# Patient Record
Sex: Female | Born: 1945 | Race: White | Hispanic: No | Marital: Single | Smoking: Current every day smoker
Health system: Southern US, Community
[De-identification: ages and names within clinical notes are randomized; demographics above are authoritative.]

## PROBLEM LIST (undated history)

## (undated) DIAGNOSIS — K219 Gastro-esophageal reflux disease without esophagitis: Secondary | ICD-10-CM

## (undated) DIAGNOSIS — F329 Major depressive disorder, single episode, unspecified: Secondary | ICD-10-CM

## (undated) DIAGNOSIS — F32A Depression, unspecified: Secondary | ICD-10-CM

## (undated) DIAGNOSIS — I10 Essential (primary) hypertension: Secondary | ICD-10-CM

## (undated) DIAGNOSIS — M810 Age-related osteoporosis without current pathological fracture: Secondary | ICD-10-CM

## (undated) DIAGNOSIS — I639 Cerebral infarction, unspecified: Secondary | ICD-10-CM

## (undated) DIAGNOSIS — N189 Chronic kidney disease, unspecified: Secondary | ICD-10-CM

## (undated) HISTORY — DX: Major depressive disorder, single episode, unspecified: F32.9

## (undated) HISTORY — DX: Chronic kidney disease, unspecified: N18.9

## (undated) HISTORY — DX: Age-related osteoporosis without current pathological fracture: M81.0

## (undated) HISTORY — DX: Depression, unspecified: F32.A

---

## 2003-06-01 ENCOUNTER — Ambulatory Visit (HOSPITAL_COMMUNITY): Admission: RE | Admit: 2003-06-01 | Discharge: 2003-06-01 | Payer: Self-pay | Admitting: Neurosurgery

## 2003-06-12 ENCOUNTER — Inpatient Hospital Stay (HOSPITAL_COMMUNITY): Admission: RE | Admit: 2003-06-12 | Discharge: 2003-06-17 | Payer: Self-pay | Admitting: Neurosurgery

## 2005-01-29 IMAGING — CR DG CHEST 2V PORT
2 series · 2 of 2 positions shown · non-contrast
Comparison: none

CLINICAL DATA: Patient having post-operative difficulty breathing after cervical disc surgery.
 PORTABLE CHEST X TWO WITH INSPIRATION AND EXPIRATION VIEWS 
 An AP inspiration view in the semierect position of the chest made on 06/12/03 at [DATE] hours is made and compared to the preoperative study of 06/11/03 and shows poorer bilateral basilar atelectasis.  There also is generalized edema of both hilar, basilar, and particularly the right lower lobe areas where there are very prominent Kerley B lines.  The heart is not significantly enlarged.  The aorta is mildly elongated and calcified.  No pleural effusion, pneumothorax, or consolidation is present. 
 An associated expiratory study shows no evidence of pneumothorax.  There is on the expiratory view even more dramatic edema seen within the Kerley B lines of the right lower lung.  There is also bilateral hilar edema again noted. 
 IMPRESSION
 Interval development of edema involving the hilar, basilar, and particularly the right lower lobe areas.  There is no consolidation or pneumothorax.

[view not recorded (1 of 2)]
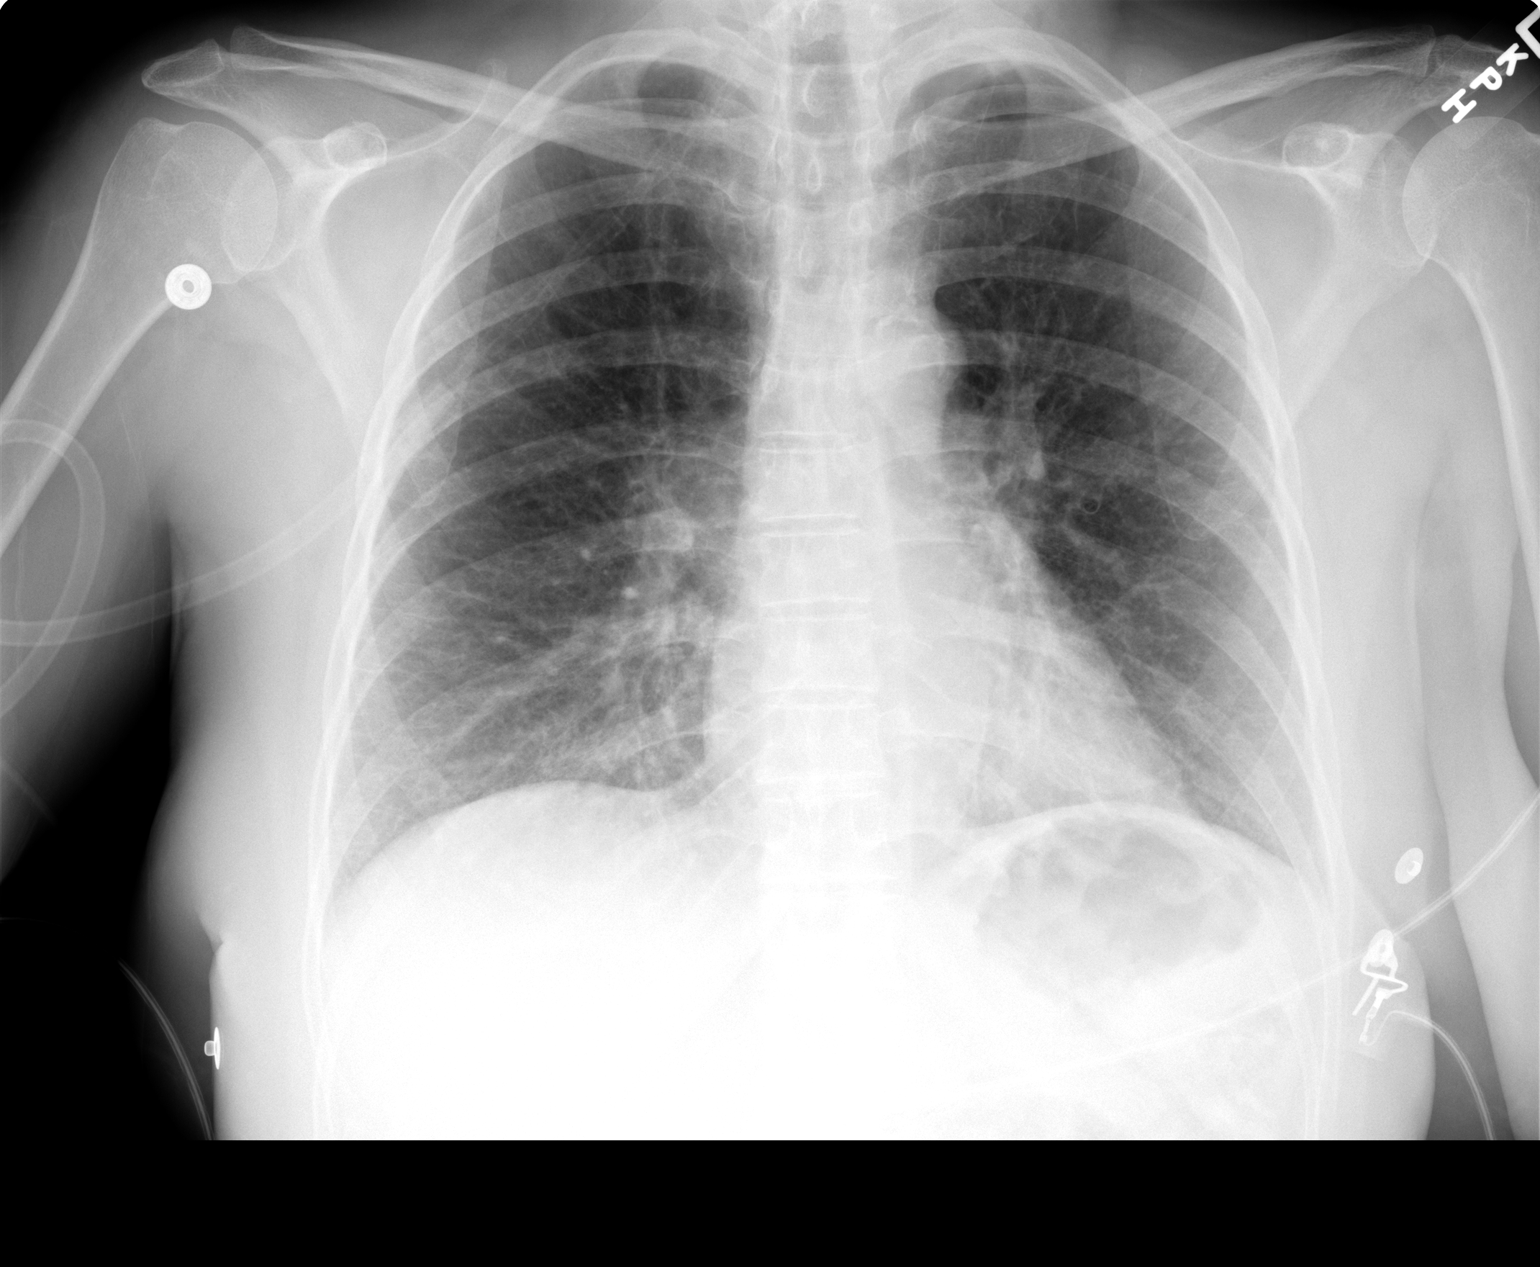

[view not recorded (2 of 2)]
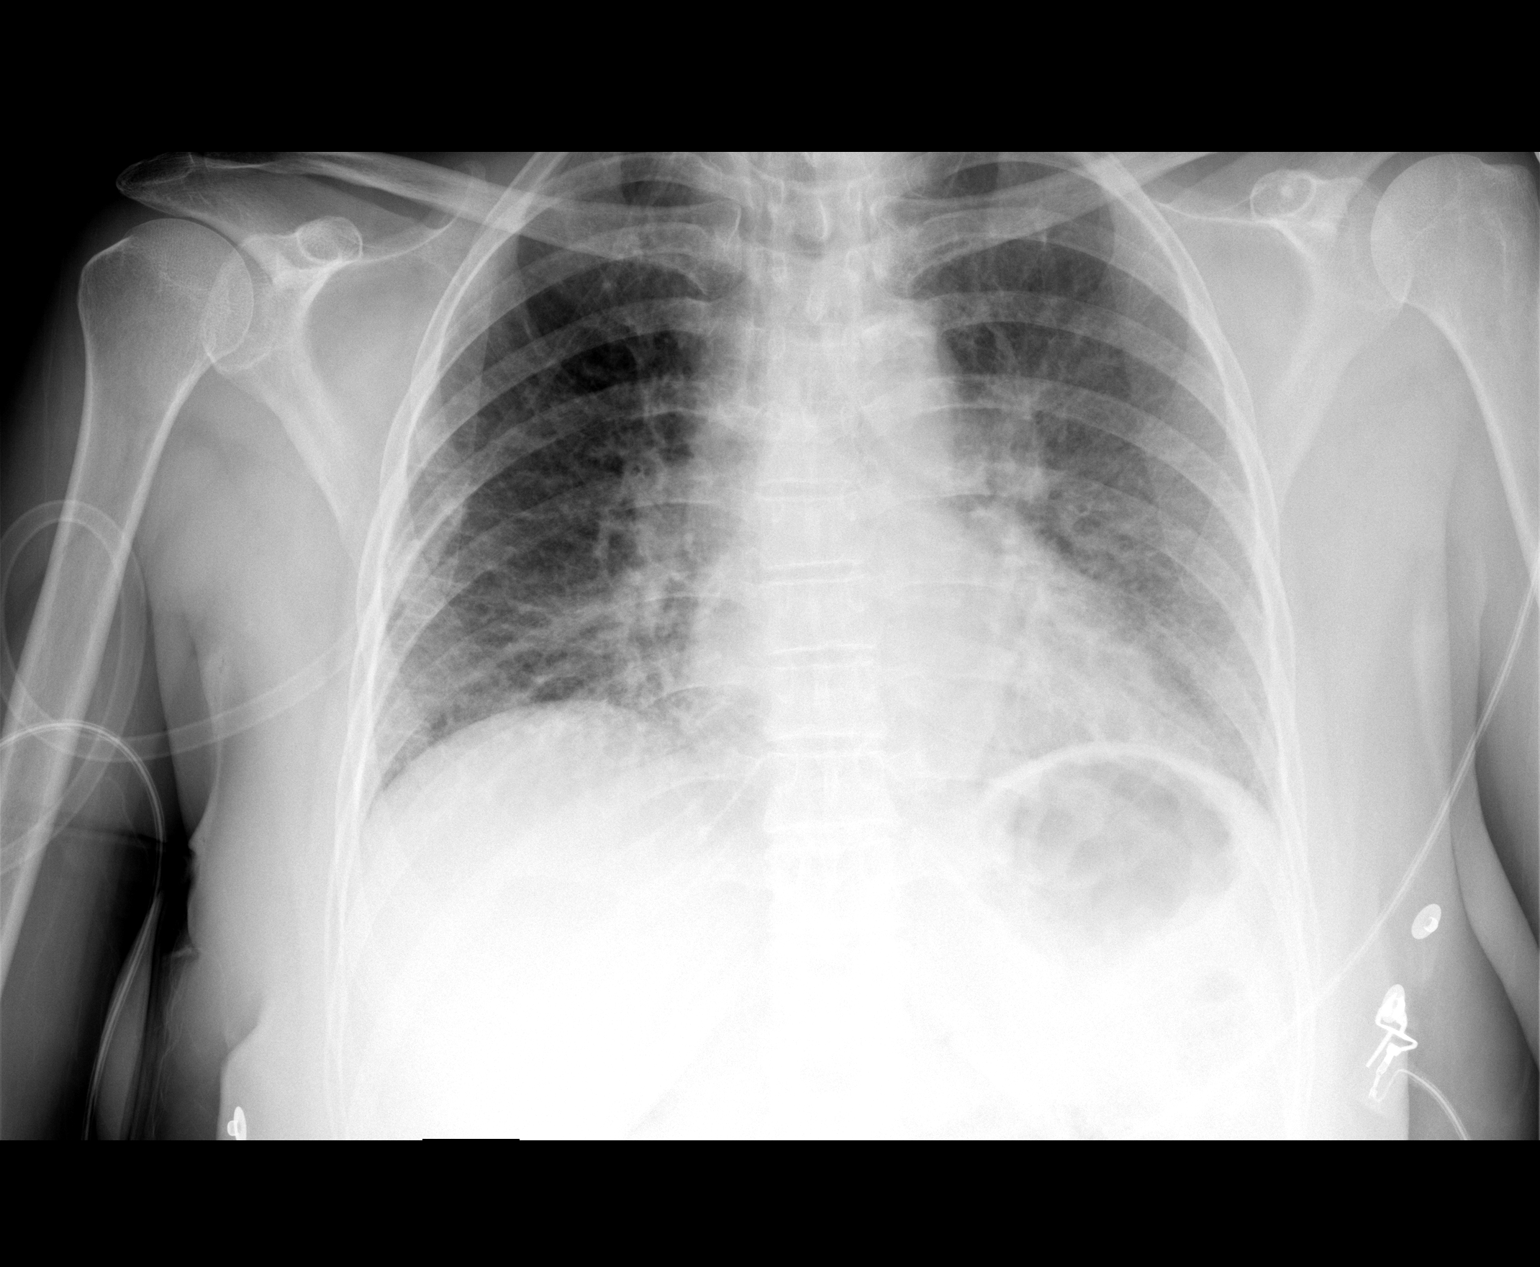

[2 of 2 positions shown; findings below may reference images not displayed]

## 2005-01-30 IMAGING — CR DG CHEST 1V PORT
1 series · 1 of 1 positions shown · non-contrast
Comparison: none

CLINICAL DATA: Pulmonary edema.  
 PORTABLE CHEST 06/13/03 
 Comparison 06/12/03. 
 The patient has Kerley B lines at the right base consistent with mild interstitial edema and the peribronchial thickening bilaterally is unchanged.  Heart size and vascularity are normal. 
 IMPRESSION
 Persistent interstitial accentuation at the bases, right greater than left.  The markings are actually slightly more accentuated at the right base than previously.

[view not recorded]
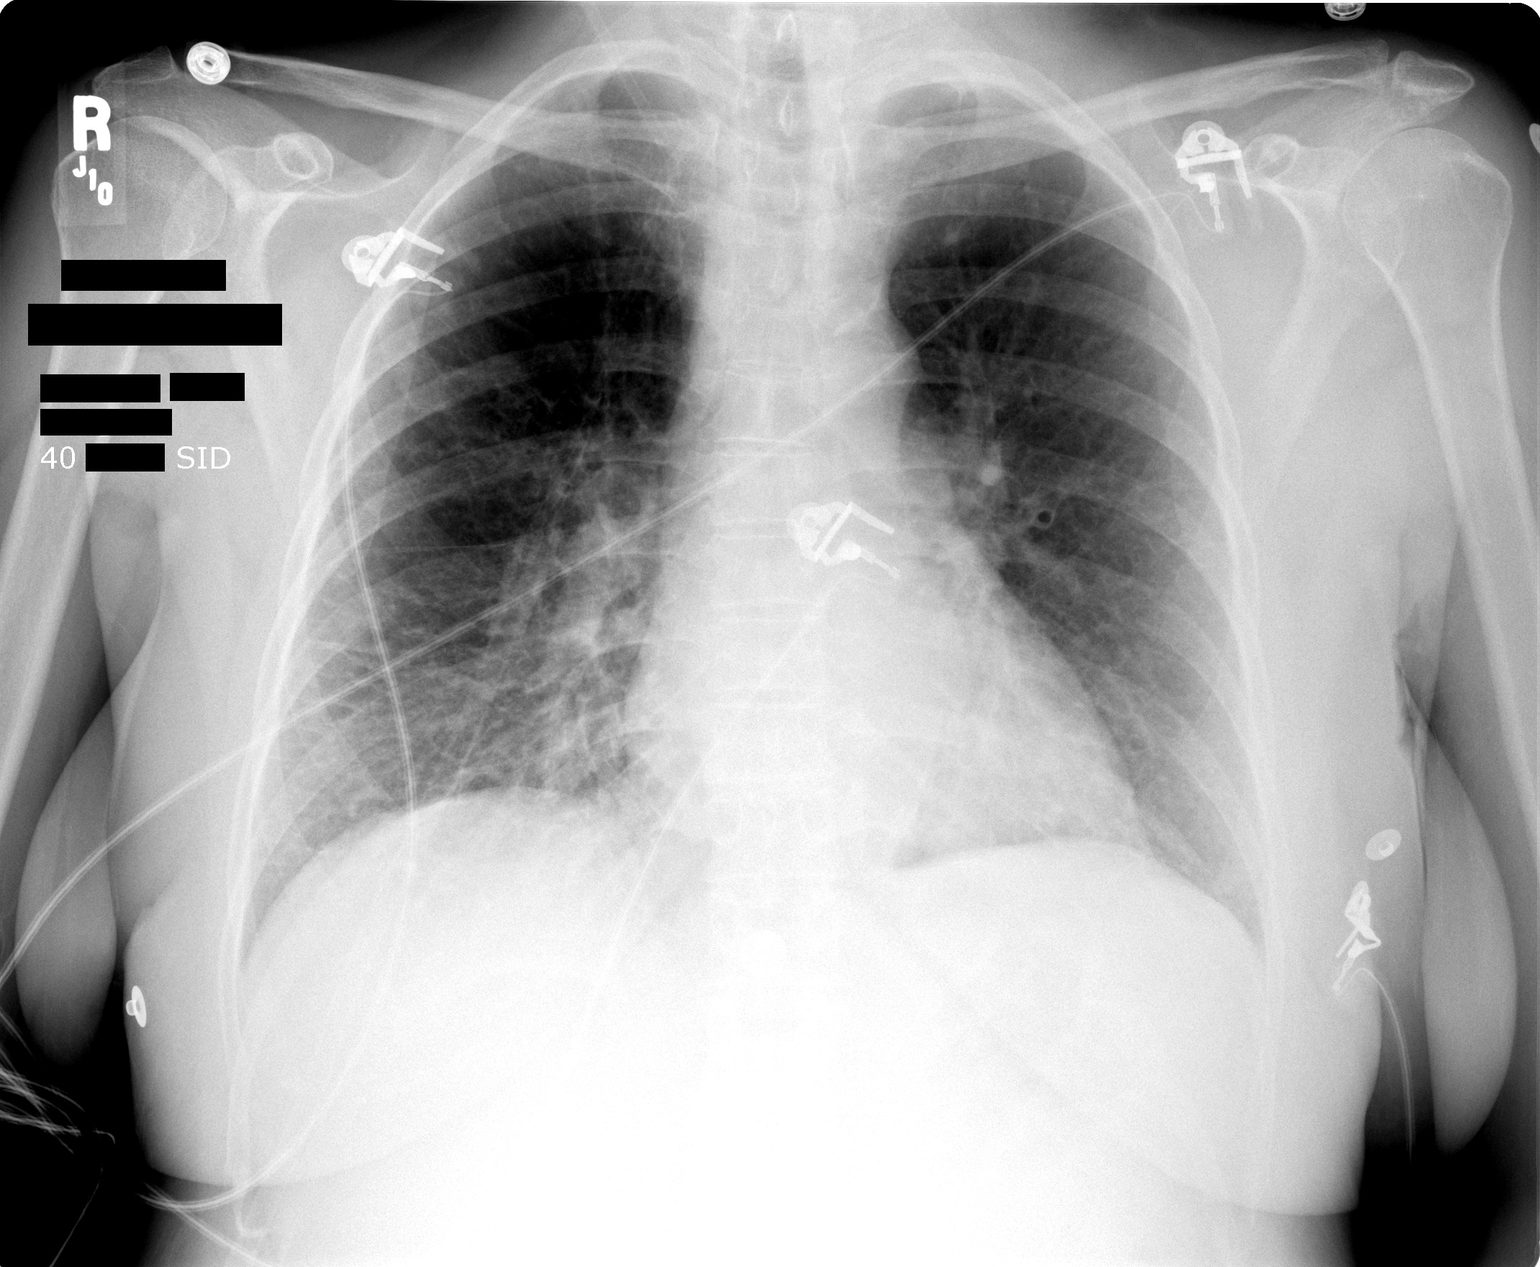

[1 of 1 positions shown; findings below may reference images not displayed]

## 2006-05-27 ENCOUNTER — Encounter: Admission: RE | Admit: 2006-05-27 | Discharge: 2006-05-27 | Payer: Self-pay | Admitting: Neurosurgery

## 2010-04-18 ENCOUNTER — Encounter: Payer: Self-pay | Admitting: Neurosurgery

## 2010-04-18 ENCOUNTER — Encounter: Payer: Self-pay | Admitting: Family Medicine

## 2010-07-22 ENCOUNTER — Other Ambulatory Visit: Payer: Self-pay | Admitting: Family Medicine

## 2010-07-22 DIAGNOSIS — Z1231 Encounter for screening mammogram for malignant neoplasm of breast: Secondary | ICD-10-CM

## 2010-07-27 ENCOUNTER — Ambulatory Visit
Admission: RE | Admit: 2010-07-27 | Discharge: 2010-07-27 | Disposition: A | Discharge status: Home / Self Care | Payer: MEDICARE | Source: Ambulatory Visit | Attending: Family Medicine | Admitting: Family Medicine

## 2010-07-27 DIAGNOSIS — Z1231 Encounter for screening mammogram for malignant neoplasm of breast: Secondary | ICD-10-CM

## 2011-09-05 ENCOUNTER — Other Ambulatory Visit: Payer: Self-pay | Admitting: Family Medicine

## 2011-09-05 DIAGNOSIS — Z1231 Encounter for screening mammogram for malignant neoplasm of breast: Secondary | ICD-10-CM

## 2011-09-12 ENCOUNTER — Ambulatory Visit
Admission: RE | Admit: 2011-09-12 | Discharge: 2011-09-12 | Disposition: A | Discharge status: Home / Self Care | Payer: Medicare Other | Source: Ambulatory Visit | Attending: Family Medicine | Admitting: Family Medicine

## 2011-09-12 DIAGNOSIS — Z1231 Encounter for screening mammogram for malignant neoplasm of breast: Secondary | ICD-10-CM

## 2011-09-20 ENCOUNTER — Ambulatory Visit: Payer: Medicaid Other | Attending: Family Medicine

## 2011-09-20 DIAGNOSIS — R5381 Other malaise: Secondary | ICD-10-CM | POA: Insufficient documentation

## 2011-09-20 DIAGNOSIS — IMO0001 Reserved for inherently not codable concepts without codable children: Secondary | ICD-10-CM | POA: Insufficient documentation

## 2011-09-20 DIAGNOSIS — M542 Cervicalgia: Secondary | ICD-10-CM | POA: Insufficient documentation

## 2011-09-20 DIAGNOSIS — M2560 Stiffness of unspecified joint, not elsewhere classified: Secondary | ICD-10-CM | POA: Insufficient documentation

## 2011-10-04 ENCOUNTER — Ambulatory Visit: Payer: Medicaid Other

## 2012-09-25 ENCOUNTER — Other Ambulatory Visit: Payer: Self-pay | Admitting: Family Medicine

## 2012-09-25 DIAGNOSIS — Z1231 Encounter for screening mammogram for malignant neoplasm of breast: Secondary | ICD-10-CM

## 2012-09-30 ENCOUNTER — Telehealth (HOSPITAL_COMMUNITY): Payer: Self-pay | Admitting: *Deleted

## 2012-09-30 ENCOUNTER — Encounter (HOSPITAL_COMMUNITY): Payer: Self-pay | Admitting: Emergency Medicine

## 2012-09-30 ENCOUNTER — Emergency Department (HOSPITAL_COMMUNITY)
Admission: EM | Admit: 2012-09-30 | Discharge: 2012-09-30 | Disposition: A | Discharge status: Home / Self Care | Payer: PRIVATE HEALTH INSURANCE | Attending: Emergency Medicine | Admitting: Emergency Medicine

## 2012-09-30 DIAGNOSIS — K089 Disorder of teeth and supporting structures, unspecified: Secondary | ICD-10-CM | POA: Insufficient documentation

## 2012-09-30 DIAGNOSIS — Z8673 Personal history of transient ischemic attack (TIA), and cerebral infarction without residual deficits: Secondary | ICD-10-CM | POA: Insufficient documentation

## 2012-09-30 DIAGNOSIS — F17200 Nicotine dependence, unspecified, uncomplicated: Secondary | ICD-10-CM | POA: Insufficient documentation

## 2012-09-30 DIAGNOSIS — I10 Essential (primary) hypertension: Secondary | ICD-10-CM | POA: Insufficient documentation

## 2012-09-30 DIAGNOSIS — K219 Gastro-esophageal reflux disease without esophagitis: Secondary | ICD-10-CM | POA: Insufficient documentation

## 2012-09-30 DIAGNOSIS — R220 Localized swelling, mass and lump, head: Secondary | ICD-10-CM | POA: Insufficient documentation

## 2012-09-30 DIAGNOSIS — K0889 Other specified disorders of teeth and supporting structures: Secondary | ICD-10-CM

## 2012-09-30 HISTORY — DX: Essential (primary) hypertension: I10

## 2012-09-30 HISTORY — DX: Gastro-esophageal reflux disease without esophagitis: K21.9

## 2012-09-30 HISTORY — DX: Cerebral infarction, unspecified: I63.9

## 2012-09-30 MED ORDER — PENICILLIN V POTASSIUM 500 MG PO TABS: 500.0000 mg | ORAL_TABLET | Freq: Four times a day (QID) | ORAL | Status: AC

## 2012-09-30 MED ORDER — HYDROCODONE-ACETAMINOPHEN 5-325 MG PO TABS
1.0000 | ORAL_TABLET | Freq: Once | ORAL | Status: AC
  Administered 2012-09-30: 1 via ORAL
  Filled 2012-09-30: qty 1

## 2012-09-30 MED ORDER — HYDROCODONE-ACETAMINOPHEN 5-325 MG PO TABS: 1.0000 | ORAL_TABLET | Freq: Four times a day (QID) | ORAL | Status: DC | PRN

## 2012-09-30 NOTE — ED Notes (Signed)
Pt reports toothache to left side. Pt presents with swelling to left side jaw.

## 2012-09-30 NOTE — ED Provider Notes (Signed)
Medical screening examination/treatment/procedure(s) were performed by non-physician practitioner and as supervising physician I was immediately available for consultation/collaboration.  Tehila Sokolow R. Wilburt Messina, MD 09/30/12 2351 

## 2012-09-30 NOTE — ED Provider Notes (Signed)
History  This chart was scribed for non-physician practitioner working with Jill Bennett. Rubin Payor, MD by Greggory Stallion, ED scribe. This patient was seen in room TR09C/TR09C and the patient's care was started at 5:29 PM.  CSN: [id number] Arrival date & time 09/30/12  1600   Chief Complaint  Patient presents with  . Dental Pain   The history is provided by the patient. No language interpreter was used.    HPI Comments: Jill Bennett is a 67 y.o. female who presents to the Emergency Department complaining of gradual onset, constant throbbing 10/10 pain on th left, upper side of her jaw that started yesterday with associated left sided facial swelling. She states the swelling has increased recently. She states she is also having pain in the lower left side. Pt states she hasn't been to a dentist because she can not afford it. Pt states she put Orajel around the tooth with no relief. Pt states she is allergic to codeine. She states when she takes it she gets itchy. Patient denies fever, night sweats, chills, difficulty swallowing or opening mouth, SOB, nuchal rigidity or decreased ROM of neck.     Past Medical History  Diagnosis Date  . GERD (gastroesophageal reflux disease)   . Hypertension   . Stroke    History reviewed. No pertinent past surgical history. No family history on file. History  Substance Use Topics  . Smoking status: Current Every Day Smoker  . Smokeless tobacco: Not on file  . Alcohol Use: No   OB History   Grav Para Term Preterm Abortions TAB SAB Ect Mult Living                 Review of Systems  HENT: Positive for facial swelling and dental problem. Negative for sore throat, trouble swallowing, neck stiffness and voice change.   Eyes: Negative for photophobia and visual disturbance.  Respiratory: Negative for shortness of breath.   Cardiovascular: Negative for chest pain.  Gastrointestinal: Negative for nausea and vomiting.  Musculoskeletal: Negative for back pain.   Skin: Negative for rash.  Neurological: Negative for weakness, light-headedness and numbness.  Psychiatric/Behavioral: The patient is not nervous/anxious.     Allergies  Codeine and Tape  Home Medications  No current outpatient prescriptions on file.  BP 157/98  Pulse 88  Temp(Src) 98.2 F (36.8 C) (Oral)  Resp 20  Ht 5' 1.5" (1.562 m)  Wt 138 lb (62.596 kg)  BMI 25.66 kg/m2  SpO2 97%  Physical Exam  Nursing note and vitals reviewed. Constitutional: She is oriented to person, place, and time. She appears well-developed and well-nourished.  HENT:  Head: Normocephalic and atraumatic. No trismus in the jaw.  Mouth/Throat: No dental abscesses. No tonsillar abscesses.  Oropharynx is clear. Teeth appear carious. Very tender at tooth #14, 15, 16 and #17 on bottom with erythema and edema of the underlying gingiva. Multiple other teeth have grounds or are previously extracted. Many teeth are missing Neck is nontender and supple without adenopathy or JVD.  Neck: Normal range of motion. Neck supple.  Cardiovascular: Normal rate.   Pulmonary/Chest: Effort normal. No respiratory distress. She has no wheezes.  Abdominal: Soft. There is no tenderness.  Musculoskeletal: Normal range of motion.  Neurological: She is alert and oriented to person, place, and time. No cranial nerve deficit.  Skin: Skin is warm and dry.  Psychiatric: She has a normal mood and affect.    ED Course  Procedures (including critical care time)  DIAGNOSTIC STUDIES: Oxygen  Saturation is 97% on RA, normal by my interpretation.    COORDINATION OF CARE: 5:41 PM-Discussed treatment plan which includes pain medication and antibiotics with pt at bedside and pt agreed to plan. Advised pt to follow up with dentist soon.   Labs Reviewed - No data to display No results found. 1. Pain, dental     MDM  Patient with dental pain.  No gross abscess.  Exam unconcerning for Ludwig's angina or spread of infection.  Will  treat with penicillin and pain medicine.  Urged patient to follow-up with dentist.  Provided resource material. Discussed options for treatment with pt who understands and is in agreement with discharge plan.   I personally performed the services described in this documentation, which was scribed in my presence. The recorded information has been reviewed and is accurate.    Glade Nurse, PA-C 09/30/12 2003

## 2012-10-22 ENCOUNTER — Ambulatory Visit
Admission: RE | Admit: 2012-10-22 | Discharge: 2012-10-22 | Disposition: A | Discharge status: Home / Self Care | Payer: PRIVATE HEALTH INSURANCE | Source: Ambulatory Visit | Attending: Family Medicine | Admitting: Family Medicine

## 2012-10-22 DIAGNOSIS — Z1231 Encounter for screening mammogram for malignant neoplasm of breast: Secondary | ICD-10-CM

## 2012-10-23 ENCOUNTER — Other Ambulatory Visit: Payer: Self-pay | Admitting: Family Medicine

## 2012-10-23 DIAGNOSIS — R928 Other abnormal and inconclusive findings on diagnostic imaging of breast: Secondary | ICD-10-CM

## 2012-11-13 ENCOUNTER — Ambulatory Visit
Admission: RE | Admit: 2012-11-13 | Discharge: 2012-11-13 | Disposition: A | Discharge status: Home / Self Care | Payer: PRIVATE HEALTH INSURANCE | Source: Ambulatory Visit | Attending: Family Medicine | Admitting: Family Medicine

## 2012-11-13 DIAGNOSIS — R928 Other abnormal and inconclusive findings on diagnostic imaging of breast: Secondary | ICD-10-CM

## 2014-11-17 ENCOUNTER — Ambulatory Visit: Payer: Medicare Other | Attending: Family Medicine | Admitting: Physical Therapy

## 2014-11-17 ENCOUNTER — Encounter: Payer: Self-pay | Admitting: Physical Therapy

## 2014-11-17 DIAGNOSIS — R296 Repeated falls: Secondary | ICD-10-CM | POA: Insufficient documentation

## 2014-11-17 DIAGNOSIS — R269 Unspecified abnormalities of gait and mobility: Secondary | ICD-10-CM

## 2014-11-17 DIAGNOSIS — M6281 Muscle weakness (generalized): Secondary | ICD-10-CM | POA: Insufficient documentation

## 2014-11-17 NOTE — Therapy (Signed)
[facility] MAIN [facility] SERVICES [address] [place], [place], [address] Phone: [phone]   Fax:  [phone]  Physical Therapy Evaluation  Patient Details  Name: Jill Bennett MRN: [id number] Date of Birth: 1946-02-21 Referring Provider:  Oswaldo Conroy, MD  Encounter Date: 11/17/2014      PT End of Session - 11/17/14 1737    Visit Number 1   Number of Visits 9   Date for PT Re-Evaluation 01/12/15   Authorization Type Gcode 1   Authorization Time Period 10   PT Start Time 1523   PT Stop Time 1602   PT Time Calculation (min) 39 min   Equipment Utilized During Treatment Gait belt   Activity Tolerance Patient tolerated treatment well;Patient limited by fatigue   Behavior During Therapy [facility] for tasks assessed/performed      Past Medical History  Diagnosis Date  . GERD (gastroesophageal reflux disease)   . Stroke   . Osteoporosis   . Depression   . Chronic kidney disease     chronic, stage III  . Hypertension     controlled    History reviewed. No pertinent past surgical history.  There were no vitals filed for this visit.  Visit Diagnosis:  Abnormality of gait - Plan: PT plan of care cert/re-cert  Muscle weakness - Plan: PT plan of care cert/re-cert  Frequent falls - Plan: PT plan of care cert/re-cert      Subjective Assessment - 11/17/14 1530    Subjective 69 yo Female reports recurrent falls and decreased balance. She presents to therapy without assistive device. Patient reports feeling stiff and sore today;Patient reports falling more often in last few years. She reports having trouble with her BP and with dizziness. Patient reports having a stroke in 1990's and reports getting back to almost independent after stroke.    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 She reports numbness/tingling in BUE  hands; She denies any numbness/tingling in feet   Limitations Walking;Standing   How long can you sit comfortably? 30 min   How long can you stand comfortably? less than 10 minutes   How long can you walk comfortably? 200-300 feet   Patient Stated Goals Be able to walk better, relieve pain in every joint, be more mobile   Currently in Pain? No/denies            [facility] PT Assessment - 11/17/14 0001    Assessment   Medical Diagnosis frequent falls   Onset Date/Surgical Date 03/28/12   Hand Dominance Right   Next MD Visit September 2016   Prior Therapy patient denies any physical therapy in the past   Precautions   Precautions Fall   Required Braces or Orthoses --  has right hand brace for carpal tunnel   Restrictions   Weight Bearing Restrictions No   Balance Screen   Has the patient fallen in the past 6 months Yes  fell getting out of bed, fell backwards in the bathtub;    How many times? 2-3   Has the patient had a decrease in activity level because of a fear of falling?  Yes   Is the patient reluctant to leave their home because of a fear of falling?  Yes   Home Environment   Additional Comments patient lives in apartment with her son. She reports 1 step to  enter home (no rails) and no steps inside; pt denies having any assistive devices at home;    Prior Function   Level of Independence Independent;Independent with gait   Cognition   Overall Cognitive Status History of cognitive impairments - at baseline  has trouble with short term and long term memory   Observation/Other Assessments   Activities of Balance Confidence Scale (ABC Scale)  41.8% (<50% low level of physical funcitoning)   Sensation   Light Touch Appears Intact  by gross assessment   Additional Comments intact deep pressure sensation for BUE and BLE   Posture/Postural Control   Posture Comments sits with erect posture, decreased lumbar lordosis; demonstrates moderate rounded shoulders;    AROM   Overall  AROM Comments BUE and BLE AROM is WFL, decreased shoulder flexion but able to reach behind head;   Strength   Overall Strength Comments BUE: shoulder grossly 3+/5, elbow 4-/5, wrist/hand 3+/5   Right Hip Flexion 3+/5   Right Hip Extension 3+/5   Right Hip ABduction 2+/5   Right Hip ADduction 4/5   Left Hip Flexion 4-/5   Left Hip Extension 4-/5   Left Hip ABduction 2+/5   Left Hip ADduction 4/5   Right Knee Flexion 4-/5   Right Knee Extension 4/5   Left Knee Flexion 4+/5   Left Knee Extension 4/5   Right Ankle Dorsiflexion 4-/5   Right Ankle Plantar Flexion 3/5   Left Ankle Dorsiflexion 4-/5   Left Ankle Plantar Flexion 3/5   Palpation   Palpation comment patient reports tenderness along right hand and throughout most joints;    Transfers   Comments able to transfer sit<>stand without HHA, but is unsteady; Patient is independent in sit<>stand transfers using HHA.    Ambulation/Gait   Gait Comments Patient ambulates with slower gait speed, unsteady gait pattern with occasional sway and scissoring. She demonstrates decreased foot clearance which could be related to poor footwear. Patient also demonstrates occasional loss of balance with knee buckling which could be related to fatigue.    Standardized Balance Assessment   Five times sit to stand comments  23.75 sec without HHA, (>65 yo,>15 sec indicates increased risk for falls)   10 Meter Walk 0.689 m/s without AD (limited ambulator, high risk for falls)   Programmer, systems Test   Sit to Stand Able to stand  independently using hands   Standing Unsupported Able to stand 2 minutes with supervision   Sitting with Back Unsupported but Feet Supported on Floor or Stool Able to sit safely and securely 2 minutes   Stand to Sit Controls descent by using hands   Transfers Able to transfer safely, definite need of hands   Standing Unsupported with Eyes Closed Able to stand 3 seconds   Standing Ubsupported with Feet Together Able to place feet  together independently and stand for 1 minute with supervision   From Standing, Reach Forward with Outstretched Arm Can reach forward >5 cm safely (2")   From Standing Position, Pick up Object from Floor Unable to pick up shoe, but reaches 2-5 cm (1-2") from shoe and balances independently   From Standing Position, Turn to Look Behind Over each Shoulder Looks behind one side only/other side shows less weight shift   Turn 360 Degrees Needs close supervision or verbal cueing   Standing Unsupported, Alternately Place Feet on Step/Stool Able to complete >2 steps/needs minimal assist   Standing Unsupported, One Foot in Front Needs help to step but can hold 15  seconds   Standing on One Leg Tries to lift leg/unable to hold 3 seconds but remains standing independently   Total Score 32                           PT Education - 11/17/14 1737    Education provided Yes   Education Details plan of care   Person(s) Educated Patient   Methods Explanation   Comprehension Verbalized understanding             PT Long Term Goals - 11/17/14 1741    PT LONG TERM GOAL #1   Title Patient will be independent in home exercise program to improve strength/mobility for better functional independence with ADLs by 01/12/15   Time 8   Period Weeks   Status New   PT LONG TERM GOAL #2   Title Patient (> 74 years old) will complete five times sit to stand test in < 15 seconds indicating an increased LE strength and improved balance by 01/12/15   Time 8   Period Weeks   Status New   PT LONG TERM GOAL #3   Title Patient will increase Berg Balance score by > 6 points to demonstrate decreased fall risk during functional activities by 01/12/15   Time 8   Period Weeks   Status New   PT LONG TERM GOAL #4   Title Patient will increase 10 meter walk test to >1.66m/s as to improve gait speed for better community ambulation and to reduce fall risk by 01/12/15   Time 8   Period Weeks   Status New    PT LONG TERM GOAL #5   Title Patient will increase ABC scale score >80% to demonstrate better functional mobility and better confidence with ADLs by 01/12/15   Time 8   Period Weeks   Status New   Additional Long Term Goals   Additional Long Term Goals Yes   PT LONG TERM GOAL #6   Title Patient will increase BUE and BLE gross strength to 4+/5 as to improve functional strength for independent gait, increased standing tolerance and increased ADL ability by 01/12/15   Time 8   Period Weeks   Status New               Plan - 11/17/14 1737    Clinical Impression Statement 69 yo Female with Chronic kidney disease stage III, HTN, depression and chronic pain presents to therapy after having recurrent falls. Patient exhibits significant weakness in BUE and BLE. She denies using any assistive device, but did require CGA for safety during gait tasks. Patient ambulates with slower gait speed, unsteady pattern, narrow base of support with occasional scissoring. She tested as a high fall risk with multiple balance tests. She denies any numbness/tingling in BLE. She would benefit from additional skilled PT Intervention to improve UE/LE strength, balance/gait safety. PT recommended 2x a week for 8 weeks, but patient expressed concern due to transportation. Therefore she will be seen 1x a week for 8 weeks based on patient request.   Pt will benefit from skilled therapeutic intervention in order to improve on the following deficits Abnormal gait;Decreased endurance;Cardiopulmonary status limiting activity;Decreased strength;Pain;Difficulty walking;Decreased mobility;Decreased balance;Dizziness;Decreased safety awareness   Rehab Potential Fair   Clinical Impairments Affecting Rehab Potential positive: motivation, good PLOF; negative: chronic pain, smoker, co-morbidities   PT Frequency 1x / week   PT Duration 8 weeks   PT Treatment/Interventions ADLs/Self Care Home Management;Neuromuscular  re-education;Patient/family education;Gait training;Stair training;Cryotherapy;Functional mobility training;Energy conservation;Therapeutic activities;Moist Heat;Balance training;Therapeutic exercise   PT Next Visit Plan initiate HEP including strengthening, balance   PT Home Exercise Plan will initiate next visit   Consulted and Agree with Plan of Care Patient          G-Codes - 2014-11-24 1744    Functional Assessment Tool Used 10 meter walk, 5 times sit<>Stand, Berg Balance assessment, Clinical judgement   Functional Limitation Mobility: Walking and moving around   Mobility: Walking and Moving Around Current Status [phone]) At least 40 percent but less than 60 percent impaired, limited or restricted   Mobility: Walking and Moving Around Goal Status [phone]) At least 1 percent but less than 20 percent impaired, limited or restricted       Problem List There are no active problems to display for this patient.  Thank you for this referral.  Hopkins,Sally Menard, PT, DPT 2014-11-24, 5:48 PM  [facility] MAIN [facility] SERVICES [address] [place], [place], [address] Phone: [phone]   Fax:  [phone]

## 2014-11-24 ENCOUNTER — Ambulatory Visit: Payer: Medicare Other | Admitting: Physical Therapy

## 2014-11-24 ENCOUNTER — Encounter: Payer: Self-pay | Admitting: Physical Therapy

## 2014-11-24 DIAGNOSIS — M6281 Muscle weakness (generalized): Secondary | ICD-10-CM

## 2014-11-24 DIAGNOSIS — R269 Unspecified abnormalities of gait and mobility: Secondary | ICD-10-CM

## 2014-11-24 DIAGNOSIS — R296 Repeated falls: Secondary | ICD-10-CM

## 2014-11-24 NOTE — Therapy (Signed)
[facility] MAIN [facility] SERVICES [address] [place], [place], [address] Phone: [phone]   Fax:  [phone]  Physical Therapy Treatment  Patient Details  Name: Jill Bennett MRN: [id number] Date of Birth: 1945-05-04 Referring Provider:  Oswaldo Conroy, MD  Encounter Date: 11/24/2014      PT End of Session - 11/24/14 1326    Visit Number 2   Number of Visits 9   Date for PT Re-Evaluation 01/12/15   Authorization Type Gcode 1   Authorization Time Period 10   PT Start Time 0115   PT Stop Time 0200   PT Time Calculation (min) 45 min   Equipment Utilized During Treatment Gait belt   Activity Tolerance Patient tolerated treatment well;Patient limited by fatigue   Behavior During Therapy [facility] for tasks assessed/performed      Past Medical History  Diagnosis Date  . GERD (gastroesophageal reflux disease)   . Stroke   . Osteoporosis   . Depression   . Chronic kidney disease     chronic, stage III  . Hypertension     controlled    History reviewed. No pertinent past surgical history.  There were no vitals filed for this visit.  Visit Diagnosis:  Abnormality of gait  Muscle weakness  Frequent falls      Subjective Assessment - 11/24/14 1325    Subjective Patient has pain complaints all over including hips, knees, wrists and has had frequent falls. She feels unsteady.    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 She reports numbness/tingling in BUE hands; She denies any numbness/tingling in feet   Limitations Walking;Standing   How long can you sit comfortably? 30 min   How long can you stand comfortably? less than 10 minutes   How long can you walk comfortably? 200-300 feet   Patient Stated Goals Be able to walk better, relieve pain in every joint, be more mobile            Neuro-ReEducation Walking on blue balance beam and side stepping on blue foam x 10 x 5 Standing feet apart and ball  Hold horizontal  and vertical  Single leg tapping fwd and bwd   Tandem stance static x 5 repetitions bilaterally in // bars with longest hold of ~ 10 seconds bilaterally     Therapeutic exericses:: Leg press with 45 lbs x 20 x 3 4 way hip with RTB  Patient needs UE to balance with posterior lean                       PT Education - 11/24/14 1326    Education provided Yes   Education Details HEP   Person(s) Educated Patient   Methods Explanation   Comprehension Verbalized understanding             PT Long Term Goals - 11/17/14 1741    PT LONG TERM GOAL #1   Title Patient will be independent in home exercise program to improve strength/mobility for better functional independence with ADLs by 01/12/15   Time 8   Period Weeks   Status New   PT LONG TERM GOAL #2   Title Patient (> 57 years old) will complete five times sit to stand test in < 15 seconds indicating an increased LE strength and improved balance by 01/12/15  Time 8   Period Weeks   Status New   PT LONG TERM GOAL #3   Title Patient will increase Berg Balance score by > 6 points to demonstrate decreased fall risk during functional activities by 01/12/15   Time 8   Period Weeks   Status New   PT LONG TERM GOAL #4   Title Patient will increase 10 meter walk test to >1.59m/s as to improve gait speed for better community ambulation and to reduce fall risk by 01/12/15   Time 8   Period Weeks   Status New   PT LONG TERM GOAL #5   Title Patient will increase ABC scale score >80% to demonstrate better functional mobility and better confidence with ADLs by 01/12/15   Time 8   Period Weeks   Status New   Additional Long Term Goals   Additional Long Term Goals Yes   PT LONG TERM GOAL #6   Title Patient will increase BUE and BLE gross strength to 4+/5 as to improve functional  strength for independent gait, increased standing tolerance and increased ADL ability by 01/12/15   Time 8   Period Weeks   Status New               Plan - 11/24/14 1355    Clinical Impression Statement Patient perfomed standing balance training exericses and gentle LE exercises and did report some pain in low back during leg press and # were lowere from 45 # to 30 lbs bilaterally. Patient has frequent loss of balance in standing with all movements .   Pt will benefit from skilled therapeutic intervention in order to improve on the following deficits Abnormal gait;Decreased endurance;Cardiopulmonary status limiting activity;Decreased strength;Pain;Difficulty walking;Decreased mobility;Decreased balance;Dizziness;Decreased safety awareness   Rehab Potential Fair   Clinical Impairments Affecting Rehab Potential positive: motivation, good PLOF; negative: chronic pain, smoker, co-morbidities   PT Frequency 1x / week   PT Duration 8 weeks   PT Treatment/Interventions ADLs/Self Care Home Management;Neuromuscular re-education;Patient/family education;Gait training;Stair training;Cryotherapy;Functional mobility training;Energy conservation;Therapeutic activities;Moist Heat;Balance training;Therapeutic exercise   PT Next Visit Plan initiate HEP including strengthening, balance   PT Home Exercise Plan will initiate next visit   Consulted and Agree with Plan of Care Patient        Problem List There are no active problems to display for this patient.   Ezekiel Ina 11/24/2014, 1:56 PM  [facility] MAIN [facility] SERVICES [address] [place], [place], [address] Phone: [phone]   Fax:  [phone]

## 2014-11-25 ENCOUNTER — Ambulatory Visit: Payer: Medicare Other | Admitting: Physical Therapy

## 2014-12-08 ENCOUNTER — Encounter: Payer: Self-pay | Admitting: Physical Therapy

## 2014-12-08 ENCOUNTER — Ambulatory Visit: Payer: Medicare Other | Attending: Family Medicine | Admitting: Physical Therapy

## 2014-12-08 ENCOUNTER — Encounter: Payer: Medicare Other | Admitting: Physical Therapy

## 2014-12-08 DIAGNOSIS — R296 Repeated falls: Secondary | ICD-10-CM | POA: Diagnosis present

## 2014-12-08 DIAGNOSIS — R269 Unspecified abnormalities of gait and mobility: Secondary | ICD-10-CM | POA: Diagnosis present

## 2014-12-08 DIAGNOSIS — M6281 Muscle weakness (generalized): Secondary | ICD-10-CM

## 2014-12-08 NOTE — Patient Instructions (Signed)
SIT TO STAND: No Device   Sit with feet shoulder-width apart, on floor.(Make sure that you are in a chair that won't move like a chair against a wall or couch etc) Lean chest forward, raise hips up from surface. Straighten hips and knees. Weight bear equally on left and right sides. 10___ reps per set, _2__ sets per day, _5__ days per week Place left leg closer to sitting surface.  Copyright  VHI. All rights reserved.  Tandem    Stand beside kitchen sink and place one foot in front of the other, lift your hand and try to hold position for 10 sec. Repeat with other foot in front;    Attempt to balance on left leg, eyes open. Hold _5-10___ seconds.Start with holding onto counter and if you get your balance you can try to let go of counter. Repeat __5__ times per set. Do __1__ sets per session. Do __1__ sessions per day. Keep eyes open:   [email or web address].exer.[email or web address]   Copyright  VHI. All rights reserved.

## 2014-12-08 NOTE — Therapy (Signed)
[facility] MAIN [facility] SERVICES [address] [place], [place], [address] Phone: [phone]   Fax:  [phone]  Physical Therapy Treatment  Patient Details  Name: Jill Bennett MRN: [id number] Date of Birth: 1945/07/30 Referring Provider:  Oswaldo Conroy, MD  Encounter Date: 12/08/2014      PT End of Session - 12/09/14 0757    Visit Number 3   Number of Visits 9   Date for PT Re-Evaluation 01/12/15   Authorization Type Gcode 3   Authorization Time Period 10   PT Start Time 1545   PT Stop Time 1630   PT Time Calculation (min) 45 min   Equipment Utilized During Treatment Gait belt   Activity Tolerance Patient tolerated treatment well;Patient limited by fatigue   Behavior During Therapy [facility] for tasks assessed/performed      Past Medical History  Diagnosis Date  . GERD (gastroesophageal reflux disease)   . Stroke   . Osteoporosis   . Depression   . Chronic kidney disease     chronic, stage III  . Hypertension     controlled    History reviewed. No pertinent past surgical history.  There were no vitals filed for this visit.  Visit Diagnosis:  Abnormality of gait  Muscle weakness  Frequent falls      Subjective Assessment - 12/08/14 1557    Subjective Patient states that she is doing "okay" today but reports that she has pain in her R hand, bilateral knees and hips.    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 She reports numbness/tingling in BUE hands; She denies any numbness/tingling in feet   Limitations Walking;Standing   How long can you sit comfortably? 30 min   How long can you stand comfortably? less than 10 minutes   How long can you walk comfortably? 200-300 feet   Patient Stated Goals Be able to walk better, relieve pain in every joint, be more mobile         Treatment:   Nustep  level 2, 3 minutes LE only (unbilled)    HEP education  Sit to Stand without UE support x10  SLS 3x15 seconds each LE (up to 7 seconds without HHA )  Tandem stance 3x30 seconds each LE (up to 20 seconds without HHA)  On airex  Normal BOS, 2x30 seconds  Narrow BOS 2x30 seconds  Head turns R and L 2x30 seconds  Head nods up and down 2x30 seconds  Raise and lower cane x10  Trunk rotation will ball pass R and L x10 each direction   PT provided CGA to increase patient safety as well as moderate verbal and tactile instruction for proper weight shift and increased hip strategy to reduce lateral and posterior LOB. Patient demonstrated only minimal response to instruction for increased neuromuscular control.   Seated therex with red tband  BLE Hip flexion 2x10  BLE Hip abduction 2x10  BLE Knee flexion 2x10  BLE Knee extension 2x10   Moderate verbal instruction provided for increased ROM and increased eccentric control to increase strengthening. Patient only demonstrated slight improvement in exercise technique following instruction from PT.  Throughout the treatment, patient required frequent instruction to remain on task. Patient also reports increased pain on the R lateral thigh with balance and therapeutic exercise due to bursitis at the greater trochanter.  PT Education - 12/09/14 0757    Education provided Yes   Education Details balance HEP, LE strengthening   Person(s) Educated Patient   Methods Explanation;Demonstration;Tactile cues;Verbal cues   Comprehension Verbalized understanding;Returned demonstration;Tactile cues required;Verbal cues required             PT Long Term Goals - 11/17/14 1741    PT LONG TERM GOAL #1   Title Patient will be independent in home exercise program to improve strength/mobility for better functional independence with ADLs by 01/12/15   Time 8   Period Weeks   Status New   PT LONG TERM GOAL #2   Title  Patient (> 35 years old) will complete five times sit to stand test in < 15 seconds indicating an increased LE strength and improved balance by 01/12/15   Time 8   Period Weeks   Status New   PT LONG TERM GOAL #3   Title Patient will increase Berg Balance score by > 6 points to demonstrate decreased fall risk during functional activities by 01/12/15   Time 8   Period Weeks   Status New   PT LONG TERM GOAL #4   Title Patient will increase 10 meter walk test to >1.70m/s as to improve gait speed for better community ambulation and to reduce fall risk by 01/12/15   Time 8   Period Weeks   Status New   PT LONG TERM GOAL #5   Title Patient will increase ABC scale score >80% to demonstrate better functional mobility and better confidence with ADLs by 01/12/15   Time 8   Period Weeks   Status New   Additional Long Term Goals   Additional Long Term Goals Yes   PT LONG TERM GOAL #6   Title Patient will increase BUE and BLE gross strength to 4+/5 as to improve functional strength for independent gait, increased standing tolerance and increased ADL ability by 01/12/15   Time 8   Period Weeks   Status New               Plan - 12/09/14 0757    Clinical Impression Statement Patient instructed in static balance exercises on this day as well as seated LE strengthening exercises. Patient reports increased pain in the R hip with both standing balance exercises and seated LE therex with red tband. Patient required Moderated verbal instruction for improved exercise technique including increased ROM and eccentric control with therex and improved weight shift and proper hip strategy to prevent posterior and lateral LOB. Only minimal response noted by patient to instruction. Frequent cueing to remain on task also required; patient reports that she has short term memory problems. Continued skilled PT is recommended to increase LE strength, improve balance and reduce fall risk.   Pt will benefit from  skilled therapeutic intervention in order to improve on the following deficits Abnormal gait;Decreased endurance;Cardiopulmonary status limiting activity;Decreased strength;Pain;Difficulty walking;Decreased mobility;Decreased balance;Dizziness;Decreased safety awareness   Rehab Potential Fair   Clinical Impairments Affecting Rehab Potential positive: motivation, good PLOF; negative: chronic pain, smoker, co-morbidities   PT Frequency 1x / week   PT Duration 8 weeks   PT Treatment/Interventions ADLs/Self Care Home Management;Neuromuscular re-education;Patient/family education;Gait training;Stair training;Cryotherapy;Functional mobility training;Energy conservation;Therapeutic activities;Moist Heat;Balance training;Therapeutic exercise   PT Next Visit Plan balance and LE strengthening.    PT Home Exercise Plan Balance HEP - see patient instructions    Consulted and Agree with Plan of Care Patient        Problem List  There are no active problems to display for this patient.  Grier Rocher SPT 12/09/2014   3:08 PM  This entire session was performed under direct supervision and direction of a licensed therapist . I have personally read, edited and approve of the note as written.  Hopkins,Margaret PT, DPT 12/09/2014, 3:08 PM  [place] MAIN [facility] SERVICES [address] [place], [place], [address] Phone: [phone]   Fax:  [phone]

## 2014-12-15 ENCOUNTER — Encounter: Payer: Medicare Other | Admitting: Physical Therapy

## 2014-12-22 ENCOUNTER — Encounter: Payer: Self-pay | Admitting: Physical Therapy

## 2014-12-22 ENCOUNTER — Encounter: Payer: Medicare Other | Admitting: Physical Therapy

## 2014-12-22 ENCOUNTER — Ambulatory Visit: Payer: Medicare Other | Admitting: Physical Therapy

## 2014-12-22 DIAGNOSIS — M6281 Muscle weakness (generalized): Secondary | ICD-10-CM

## 2014-12-22 DIAGNOSIS — R296 Repeated falls: Secondary | ICD-10-CM

## 2014-12-22 DIAGNOSIS — R269 Unspecified abnormalities of gait and mobility: Secondary | ICD-10-CM

## 2014-12-22 NOTE — Therapy (Signed)
[place] [facility] MAIN [facility] SERVICES [address] [place], [place], [address] Phone: [phone]   Fax:  [phone]  Physical Therapy Treatment  Patient Details  Name: Jill Bennett MRN: [id number] Date of Birth: 1945/10/29 Referring Provider:  Oswaldo Conroy, MD  Encounter Date: 12/22/2014      PT End of Session - 12/22/14 1623    Visit Number 4   Number of Visits 9   Date for PT Re-Evaluation 01/12/15   Authorization Type Gcode 4   Authorization Time Period 10   PT Start Time 1600   PT Stop Time 1640   PT Time Calculation (min) 40 min   Equipment Utilized During Treatment Gait belt   Activity Tolerance Patient tolerated treatment well;Patient limited by fatigue   Behavior During Therapy [facility] for tasks assessed/performed      Past Medical History  Diagnosis Date  . GERD (gastroesophageal reflux disease)   . Stroke   . Osteoporosis   . Depression   . Chronic kidney disease     chronic, stage III  . Hypertension     controlled    History reviewed. No pertinent past surgical history.  There were no vitals filed for this visit.  Visit Diagnosis:  Muscle weakness  Abnormality of gait  Frequent falls      Subjective Assessment - 12/22/14 1602    Subjective Arrived late to PT by 15 minutes. Patient reports that she has some back, hip and knee pain upon arrival to PT. She has decided to start coming to Physical therapy 2 times per month, due to financial concerns. She states that she likes to come to PT, and feels like it helps when she is here, but she cannot afford to come more than twice per month.   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 She reports numbness/tingling in BUE hands; She denies any numbness/tingling in feet   Limitations Walking;Standing   How long can you sit comfortably? 30 min   How long  can you stand comfortably? less than 10 minutes   How long can you walk comfortably? 200-300 feet   Patient Stated Goals Be able to walk better, relieve pain in every joint, be more mobile   Currently in Pain? Yes   Pain Score 4    Pain Location Leg   Pain Orientation Right   Pain Descriptors / Indicators Aching   Pain Type Chronic pain         Treatment:   Nustep level 2, 3 minutes LE only (unbilled)   Balance training  Sit to Stand without UE support x8  SLS 3x15 seconds each LE (up to 7 seconds without HHA )  Tandem stance 3x30 seconds each LE (up to 20 seconds without HHA)   PT provided CGA to increase patient safety as well as moderate verbal and tactile instruction for proper weight shift and increased hip strategy to reduce lateral and posterior LOB. Patient demonstrated only minimal response to instruction for increased neuromuscular control.   Seated HEP with yellow tband  BLE Hip flexion 2x10  BLE Hip abduction 2x10  BLE Knee flexion 2x10  BLE Knee extension 2x10   Standing HEP with yellow tband  Hip flexion x6 BLE  Hip abduction x6 BLE  Moderate verbal instruction provided for increased ROM and increased eccentric control to increase strengthening. Patient only demonstrated  slight improvement in exercise technique following instruction from PT.  Patient required moderate instruction to remain on task. Patient reports increased pain in the low back and the R LE greater trochanter throughout treatment.                           PT Education - 12/22/14 1620    Education provided Yes   Education Details LE strengthening therex, balance training   Person(s) Educated Patient   Methods Explanation;Demonstration;Tactile cues;Verbal cues   Comprehension Verbalized understanding;Returned demonstration;Verbal cues required             PT Long Term Goals - 11/17/14 1741    PT LONG TERM GOAL #1   Title Patient will be independent in  home exercise program to improve strength/mobility for better functional independence with ADLs by 01/12/15   Time 8   Period Weeks   Status New   PT LONG TERM GOAL #2   Title Patient (> 72 years old) will complete five times sit to stand test in < 15 seconds indicating an increased LE strength and improved balance by 01/12/15   Time 8   Period Weeks   Status New   PT LONG TERM GOAL #3   Title Patient will increase Berg Balance score by > 6 points to demonstrate decreased fall risk during functional activities by 01/12/15   Time 8   Period Weeks   Status New   PT LONG TERM GOAL #4   Title Patient will increase 10 meter walk test to >1.65m/s as to improve gait speed for better community ambulation and to reduce fall risk by 01/12/15   Time 8   Period Weeks   Status New   PT LONG TERM GOAL #5   Title Patient will increase ABC scale score >80% to demonstrate better functional mobility and better confidence with ADLs by 01/12/15   Time 8   Period Weeks   Status New   Additional Long Term Goals   Additional Long Term Goals Yes   PT LONG TERM GOAL #6   Title Patient will increase BUE and BLE gross strength to 4+/5 as to improve functional strength for independent gait, increased standing tolerance and increased ADL ability by 01/12/15   Time 8   Period Weeks   Status New               Plan - 12/22/14 1720    Clinical Impression Statement Patient reports that she will only be able to afford to come to PT 2 times per month. Patient was re-educated in balance HEP as well as trained in sitting as standing home exercises. PT provided supervision assist for balance exercises and moderate instruction for increased proper UE support and exercise technique. Moderate instruction provided with LE strengthening including increase ROM and improved speed of movement. Patient responded moderately to instruction. Continued skilled PT is recommended to increase LE strength and improve balance to  reduce fall risk.     Pt will benefit from skilled therapeutic intervention in order to improve on the following deficits Abnormal gait;Decreased endurance;Cardiopulmonary status limiting activity;Decreased strength;Pain;Difficulty walking;Decreased mobility;Decreased balance;Dizziness;Decreased safety awareness   Rehab Potential Fair   Clinical Impairments Affecting Rehab Potential positive: motivation, good PLOF; negative: chronic pain, smoker, co-morbidities   PT Frequency 1x / week   PT Duration 8 weeks   PT Treatment/Interventions ADLs/Self Care Home Management;Neuromuscular re-education;Patient/family education;Gait training;Stair training;Cryotherapy;Functional mobility training;Energy conservation;Therapeutic activities;Moist Heat;Balance training;Therapeutic exercise  PT Next Visit Plan balance and LE strengthening.    PT Home Exercise Plan Strengthening HEP. See patient instructions.    Consulted and Agree with Plan of Care Patient        Problem List There are no active problems to display for this patient.  Grier Rocher SPT 12/23/2014   9:34 AM  This entire session was performed under direct supervision and direction of a licensed therapist . I have personally read, edited and approve of the note as written.  Hopkins,Margaret PT, DPT 12/23/2014, 9:34 AM  [place] [facility] MAIN [facility] SERVICES [address] [place], [place], [address] Phone: [phone]   Fax:  [phone]

## 2014-12-22 NOTE — Patient Instructions (Addendum)
    FLEXION: Sitting - Resistance Band (Active)   Sit, both feet flat. Against yellow resistance band, lift right knee toward ceiling. Complete __2_ sets of _8__ repetitions. Perform __2_ sessions per day.  http://gtsc.exer.us/21   Copyright  VHI. All rights reserved.  ABDUCTION: Sitting - Resistance Band (Active)   Sit with feet flat. Lift right leg slightly and, against yellow resistance band, draw it out to side. Complete __2_ sets of __8_ repetitions. Perform 2__ sessions per day.  Copyright  VHI. All rights reserved.  ABDUCTION: Standing - Resistance Band (Active)   Stand, feet flat. Against yellow resistance band, lift right leg out to side. Complete _2__ sets of 10___ repetitions. Perform _2_ sessions per day.  http://gtsc.exer.us/117   Copyright  VHI. All rights reserved.  Balance, Proprioception: Hip Flexion With Tubing   With tubing attached to ankle of uninvolved leg, swing leg forward. Return. Repeat _8___ times or for __2 sets . Do _2___ sessions per day.  http://cc.exer.us/18   Copyright  VHI. All rights reserved.  EXTENSION: Sitting - Resistance Band (Active)   Sit with feet flat. Against yellow resistance band, straighten right knee. Complete __2_ sets of 10___ repetitions. Perform 2___ sessions per day.  Copyright  VHI. All rights reserved.

## 2014-12-29 ENCOUNTER — Encounter: Payer: Medicare Other | Admitting: Physical Therapy

## 2015-01-05 ENCOUNTER — Encounter: Payer: Medicare Other | Admitting: Physical Therapy

## 2015-01-12 ENCOUNTER — Encounter: Payer: Medicare Other | Admitting: Physical Therapy

## 2015-01-19 ENCOUNTER — Encounter: Payer: Medicare Other | Admitting: Physical Therapy

## 2015-01-19 ENCOUNTER — Encounter: Payer: Self-pay | Admitting: Physical Therapy

## 2015-01-19 ENCOUNTER — Ambulatory Visit: Payer: Medicare Other | Attending: Family Medicine | Admitting: Physical Therapy

## 2015-01-19 DIAGNOSIS — M6281 Muscle weakness (generalized): Secondary | ICD-10-CM | POA: Diagnosis not present

## 2015-01-19 DIAGNOSIS — R269 Unspecified abnormalities of gait and mobility: Secondary | ICD-10-CM | POA: Diagnosis present

## 2015-01-19 DIAGNOSIS — R296 Repeated falls: Secondary | ICD-10-CM | POA: Diagnosis present

## 2015-01-20 DIAGNOSIS — M6281 Muscle weakness (generalized): Secondary | ICD-10-CM | POA: Diagnosis not present

## 2015-01-20 NOTE — Therapy (Addendum)
[place] MAIN [facility] SERVICES [address] [place], [place], [address] Phone: [phone]   Fax:  [phone]  Physical Therapy Treatment/discharge summary  11/17/14-01/19/15   Patient Details  Name: Jill Bennett MRN: [id number] Date of Birth: [birth year] No Data Recorded  Encounter Date: 01/19/2015      PT End of Session - 01/19/15 1636    Visit Number 5   Number of Visits 9   Date for PT Re-Evaluation 01/12/15   Authorization Type Gcode 5   Authorization Time Period 10   PT Start Time 1630   PT Stop Time 1708   PT Time Calculation (min) 38 min   Equipment Utilized During Treatment Gait belt   Activity Tolerance Patient tolerated treatment well;Patient limited by fatigue   Behavior During Therapy [facility] for tasks assessed/performed      Past Medical History  Diagnosis Date  . GERD (gastroesophageal reflux disease)   . Stroke ([place])   . Osteoporosis   . Depression   . Chronic kidney disease     chronic, stage III  . Hypertension     controlled    History reviewed. No pertinent past surgical history.  There were no vitals filed for this visit.  Visit Diagnosis:  Muscle weakness - Plan: PT plan of care cert/re-cert  Abnormality of gait - Plan: PT plan of care cert/re-cert  Frequent falls - Plan: PT plan of care cert/re-cert      Subjective Assessment - 01/19/15 1630    Subjective Patient arrived late to PT. She has not been seen by PT since 12/22/14. She states that she is still having a lot of hip and knee pain, and reports some ankle pain has developed recently. She states that she has had no new falls since last PT visit. She reports that she has not been very consistent with HEP.    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 She reports numbness/tingling in BUE hands; She denies any numbness/tingling in feet    Limitations Walking;Standing   How long can you sit comfortably? 30 min   How long can you stand comfortably? less than 10 minutes   How long can you walk comfortably? 200-300 feet   Patient Stated Goals Be able to walk better, relieve pain in every joint, be more mobile   Currently in Pain? Yes   Pain Score 2    Pain Location Hip   Pain Orientation Right;Left   Pain Descriptors / Indicators Aching   Pain Type Chronic pain           Patient instructed in standardized outcome measures to assess progress toward goals including Berg balance Scale, 10 m Walk test, ABCs, 5x STS and manual muscle testing. See above for results  PT provided moderate verbal instruction for improved technique with 5xSTS and Berg balance scale. CGA was provided to improve patient safety with all standardized outcome measures.                       PT Education - 01/19/15 1636    Education provided Yes   Education Details standardized outcome measures, discharge plan    Person(s) Educated Patient   Methods Explanation;Demonstration;Tactile cues;Verbal cues   Comprehension Verbalized understanding;Returned demonstration;Verbal cues required;Tactile cues required             PT Long Term  Goals - 01/19/15 1637    PT LONG TERM GOAL #1   Title Patient will be independent in home exercise program to improve strength/mobility for better functional independence with ADLs by 01/12/15   Time 8   Period Weeks   Status Partially Met   PT LONG TERM GOAL #2   Title Patient (> 38 years old) will complete five times sit to stand test in < 15 seconds indicating an increased LE strength and improved balance by 01/12/15   Time 8   Period Weeks   Status Not Met   PT LONG TERM GOAL #3   Title Patient will increase Berg Balance score by > 6 points to demonstrate decreased fall risk during functional activities by 01/12/15   Time 8   Period Weeks   Status Partially Met   PT LONG TERM GOAL #4    Title Patient will increase 10 meter walk test to >1.38m/s as to improve gait speed for better community ambulation and to reduce fall risk by 01/12/15   Time 8   Period Weeks   Status Not Met   PT LONG TERM GOAL #5   Title Patient will increase ABC scale score >80% to demonstrate better functional mobility and better confidence with ADLs by 01/12/15   Time 8   Period Weeks   Status Not Met   PT LONG TERM GOAL #6   Title Patient will increase BUE and BLE gross strength to 4+/5 as to improve functional strength for independent gait, increased standing tolerance and increased ADL ability by 01/12/15   Time 8   Period Weeks   Status Not Met               Plan - 01/29/15 0747    Clinical Impression Statement Patient has not been to PT since 12/22/14. PT instructed patient in standardized outcome measures to assess progress toward goals. Patient demonstrated decrease in balance and function on the ABC-s as well as on the 5 times sit to stand. Mild improvement on the Berg Balance scale was noted, but not enough to indicate decrease in fall risk from PT eval. No change was seen in gait speed or overall LE strength except hip abduction and adduction. Patient also reports that she has not performed any home exercises in over a week. Due to lack of adherence to HEP and lack of progress toward goals, skilled PT is no longer recommended at this time. Patient was instructed to follow up with referring provider following PT treatment.   Pt will benefit from skilled therapeutic intervention in order to improve on the following deficits Abnormal gait;Decreased endurance;Cardiopulmonary status limiting activity;Decreased strength;Pain;Difficulty walking;Decreased mobility;Decreased balance;Dizziness;Decreased safety awareness   Rehab Potential Fair   Clinical Impairments Affecting Rehab Potential positive: motivation, good PLOF; negative: chronic pain, smoker, co-morbidities   PT Frequency 1x / week    PT Duration 8 weeks   PT Treatment/Interventions ADLs/Self Care Home Management;Neuromuscular re-education;Patient/family education;Gait training;Stair training;Cryotherapy;Functional mobility training;Energy conservation;Therapeutic activities;Moist Heat;Balance training;Therapeutic exercise   PT Next Visit Plan Pt D/C from PT    PT Home Exercise Plan continue as given.    Consulted and Agree with Plan of Care Patient          G-Codes - 01-29-15 0754    Functional Assessment Tool Used 10 meter walk, 5 times sit<>Stand, Berg Balance assessment, Clinical judgement   Functional Limitation Mobility: Walking and moving around   Mobility: Walking and Moving Around Current Status [phone]) At least 40 percent but  less than 60 percent impaired, limited or restricted   Mobility: Walking and Moving Around Goal Status [phone]) At least 1 percent but less than 20 percent impaired, limited or restricted   Mobility: Walking and Moving Around Discharge Status [phone]) At least 40 percent but less than 60 percent impaired, limited or restricted      Problem List There are no active problems to display for this patient.  Barrie Folk SPT 01/20/2015   2:30 PM   This entire session was performed under direct supervision and direction of a licensed therapist/therapist assistant . I have personally read, edited and approve of the note as written. Kerman Passey, PT, DPT    01/20/2015, 2:30 PM  [place] MAIN [facility] SERVICES [address] [place], [place], [address] Phone: [phone]   Fax:  [phone]  Name: Jill Bennett MRN: [id number] Date of Birth: 1945/08/28

## 2015-11-25 ENCOUNTER — Other Ambulatory Visit: Payer: Self-pay

## 2015-11-25 DIAGNOSIS — Z1231 Encounter for screening mammogram for malignant neoplasm of breast: Secondary | ICD-10-CM

## 2015-12-07 ENCOUNTER — Ambulatory Visit: Payer: Medicare Other

## 2015-12-07 ENCOUNTER — Ambulatory Visit
Admission: RE | Admit: 2015-12-07 | Discharge: 2015-12-07 | Disposition: A | Discharge status: Home / Self Care | Payer: Medicare Other | Source: Ambulatory Visit | Attending: Internal Medicine | Admitting: Internal Medicine

## 2015-12-07 DIAGNOSIS — Z1231 Encounter for screening mammogram for malignant neoplasm of breast: Secondary | ICD-10-CM

## 2018-02-08 ENCOUNTER — Other Ambulatory Visit: Payer: Self-pay | Admitting: Family Medicine

## 2018-02-09 ENCOUNTER — Other Ambulatory Visit: Payer: Self-pay | Admitting: Family Medicine

## 2018-02-09 DIAGNOSIS — Z1231 Encounter for screening mammogram for malignant neoplasm of breast: Secondary | ICD-10-CM

## 2018-06-20 ENCOUNTER — Other Ambulatory Visit: Payer: Self-pay | Admitting: Family Medicine

## 2018-06-20 DIAGNOSIS — Z1231 Encounter for screening mammogram for malignant neoplasm of breast: Secondary | ICD-10-CM

## 2019-04-02 ENCOUNTER — Other Ambulatory Visit: Payer: Self-pay | Admitting: Family Medicine

## 2019-04-02 DIAGNOSIS — Z1231 Encounter for screening mammogram for malignant neoplasm of breast: Secondary | ICD-10-CM

## 2019-04-02 DIAGNOSIS — Z0000 Encounter for general adult medical examination without abnormal findings: Secondary | ICD-10-CM

## 2019-04-11 ENCOUNTER — Encounter: Payer: Self-pay | Admitting: *Deleted

## 2024-02-14 ENCOUNTER — Telehealth: Payer: Self-pay | Admitting: *Deleted

## 2024-02-14 NOTE — Telephone Encounter (Signed)
 Received voicemail from Madelin Sharps at Dr Alberta office regarding referral for lung cancer screening. Called and spoke with Dr Alberta office and left message to let Dr Glover know that pt is not eligible for lung cancer screening due to CMS age guidelines. I left our call back number if they have any questions.

## 2024-05-01 ENCOUNTER — Emergency Department (HOSPITAL_COMMUNITY)

## 2024-05-01 ENCOUNTER — Emergency Department (HOSPITAL_COMMUNITY)
Admission: EM | Admit: 2024-05-01 | Discharge: 2024-05-26 | Disposition: E | Source: Home / Self Care | Attending: Emergency Medicine | Admitting: Emergency Medicine

## 2024-05-01 DIAGNOSIS — E8779 Other fluid overload: Secondary | ICD-10-CM

## 2024-05-01 DIAGNOSIS — I469 Cardiac arrest, cause unspecified: Secondary | ICD-10-CM

## 2024-05-01 DIAGNOSIS — I4891 Unspecified atrial fibrillation: Secondary | ICD-10-CM

## 2024-05-01 DIAGNOSIS — J9601 Acute respiratory failure with hypoxia: Secondary | ICD-10-CM

## 2024-05-01 LAB — I-STAT CG4 LACTIC ACID, ED: Lactic Acid, Venous: 5.5 mmol/L (ref 0.5–1.9)

## 2024-05-01 LAB — BLOOD GAS, VENOUS
Acid-base deficit: 5.1 mmol/L — ABNORMAL HIGH (ref 0.0–2.0)
Bicarbonate: 24 mmol/L (ref 20.0–28.0)
O2 Saturation: 8.9 %
Patient temperature: 37
pCO2, Ven: 60 mmHg (ref 44–60)
pH, Ven: 7.21 — ABNORMAL LOW (ref 7.25–7.43)
pO2, Ven: <31 mmHg — CL (ref 32–45)

## 2024-05-01 LAB — COMPREHENSIVE METABOLIC PANEL WITH GFR
ALT: 101 U/L — ABNORMAL HIGH (ref 0–44)
AST: 90 U/L — ABNORMAL HIGH (ref 15–41)
Albumin: 4.3 g/dL (ref 3.5–5.0)
Alkaline Phosphatase: 243 U/L — ABNORMAL HIGH (ref 38–126)
Anion gap: 19 — ABNORMAL HIGH (ref 5–15)
BUN: 41 mg/dL — ABNORMAL HIGH (ref 8–23)
CO2: 20 mmol/L — ABNORMAL LOW (ref 22–32)
Calcium: 9.9 mg/dL (ref 8.9–10.3)
Chloride: 100 mmol/L (ref 98–111)
Creatinine, Ser: 2.35 mg/dL — ABNORMAL HIGH (ref 0.44–1.00)
GFR, Estimated: 20 mL/min — ABNORMAL LOW
Glucose, Bld: 153 mg/dL — ABNORMAL HIGH (ref 70–99)
Potassium: 5.8 mmol/L — ABNORMAL HIGH (ref 3.5–5.1)
Sodium: 139 mmol/L (ref 135–145)
Total Bilirubin: 1.1 mg/dL (ref 0.0–1.2)
Total Protein: 7.8 g/dL (ref 6.5–8.1)

## 2024-05-01 LAB — CK: Total CK: 126 U/L (ref 38–234)

## 2024-05-01 LAB — I-STAT CHEM 8, ED
BUN: 57 mg/dL — ABNORMAL HIGH (ref 8–23)
Calcium, Ion: 1.08 mmol/L — ABNORMAL LOW (ref 1.15–1.40)
Chloride: 106 mmol/L (ref 98–111)
Creatinine, Ser: 2.4 mg/dL — ABNORMAL HIGH (ref 0.44–1.00)
Glucose, Bld: 144 mg/dL — ABNORMAL HIGH (ref 70–99)
HCT: 55 % — ABNORMAL HIGH (ref 36.0–46.0)
Hemoglobin: 18.7 g/dL — ABNORMAL HIGH (ref 12.0–15.0)
Potassium: 5.7 mmol/L — ABNORMAL HIGH (ref 3.5–5.1)
Sodium: 138 mmol/L (ref 135–145)
TCO2: 22 mmol/L (ref 22–32)

## 2024-05-01 LAB — PRO BRAIN NATRIURETIC PEPTIDE: Pro Brain Natriuretic Peptide: 9600 pg/mL — ABNORMAL HIGH

## 2024-05-01 LAB — PROTIME-INR
INR: 1.1 (ref 0.8–1.2)
Prothrombin Time: 14.7 s (ref 11.4–15.2)

## 2024-05-01 LAB — TROPONIN T, HIGH SENSITIVITY: Troponin T High Sensitivity: 49 ng/L — ABNORMAL HIGH (ref 0–19)

## 2024-05-01 LAB — MAGNESIUM: Magnesium: 2.2 mg/dL (ref 1.7–2.4)

## 2024-05-01 MED ORDER — VANCOMYCIN HCL 500 MG/100ML IV SOLN
500.0000 mg | INTRAVENOUS | Status: DC
  Filled 2024-05-01: qty 100

## 2024-05-01 MED ORDER — PHENYLEPHRINE 80 MCG/ML (10ML) SYRINGE FOR IV PUSH (FOR BLOOD PRESSURE SUPPORT)
PREFILLED_SYRINGE | INTRAVENOUS | Status: AC
  Filled 2024-05-01: qty 10

## 2024-05-01 MED ORDER — AMIODARONE LOAD VIA INFUSION
150.0000 mg | Freq: Once | INTRAVENOUS | Status: AC
  Administered 2024-05-01: 150 mg via INTRAVENOUS
  Filled 2024-05-01: qty 83.34

## 2024-05-01 MED ORDER — FUROSEMIDE 10 MG/ML IJ SOLN
80.0000 mg | Freq: Once | INTRAMUSCULAR | Status: AC
  Administered 2024-05-01: 80 mg via INTRAVENOUS
  Filled 2024-05-01: qty 8

## 2024-05-01 MED ORDER — DEXMEDETOMIDINE HCL IN NACL 400 MCG/100ML IV SOLN: 0.0000 ug/kg/h | INTRAVENOUS | Status: DC

## 2024-05-01 MED ORDER — LACTATED RINGERS IV BOLUS (SEPSIS)
1000.0000 mL | Freq: Once | INTRAVENOUS | Status: AC
  Administered 2024-05-01: 1000 mL via INTRAVENOUS

## 2024-05-01 MED ORDER — PROPOFOL 1000 MG/100ML IV EMUL: 0.0000 ug/kg/min | INTRAVENOUS | Status: DC

## 2024-05-01 MED ORDER — DEXMEDETOMIDINE HCL IN NACL 400 MCG/100ML IV SOLN
INTRAVENOUS | Status: AC
  Filled 2024-05-01: qty 100

## 2024-05-01 MED ORDER — HALOPERIDOL LACTATE 5 MG/ML IJ SOLN
INTRAMUSCULAR | Status: AC
  Administered 2024-05-01: 1 mg via INTRAVENOUS
  Filled 2024-05-01: qty 1

## 2024-05-01 MED ORDER — AMIODARONE HCL IN DEXTROSE 360-4.14 MG/200ML-% IV SOLN
60.0000 mg/h | INTRAVENOUS | Status: DC
  Administered 2024-05-01: 60 mg/h via INTRAVENOUS
  Filled 2024-05-01: qty 200

## 2024-05-01 MED ORDER — SODIUM CHLORIDE 0.9 % IV SOLN
2.0000 g | Freq: Once | INTRAVENOUS | Status: AC
  Administered 2024-05-01: 2 g via INTRAVENOUS
  Filled 2024-05-01: qty 20

## 2024-05-01 MED ORDER — IPRATROPIUM-ALBUTEROL 0.5-2.5 (3) MG/3ML IN SOLN
3.0000 mL | Freq: Once | RESPIRATORY_TRACT | Status: AC
  Administered 2024-05-01: 3 mL via RESPIRATORY_TRACT
  Filled 2024-05-01: qty 3

## 2024-05-01 MED ORDER — DEXMEDETOMIDINE BOLUS VIA INFUSION
0.5000 ug/kg | Freq: Once | INTRAVENOUS | Status: DC
  Filled 2024-05-01: qty 18

## 2024-05-01 MED ORDER — EPINEPHRINE HCL 5 MG/250ML IV SOLN IN NS
INTRAVENOUS | Status: AC
  Filled 2024-05-01: qty 250

## 2024-05-01 MED ORDER — NOREPINEPHRINE 4 MG/250ML-% IV SOLN
INTRAVENOUS | Status: AC
  Filled 2024-05-01: qty 250

## 2024-05-01 MED ORDER — EPINEPHRINE 1 MG/10ML IV SOSY
PREFILLED_SYRINGE | INTRAVENOUS | Status: AC
  Filled 2024-05-01: qty 10

## 2024-05-01 MED ORDER — VASOPRESSIN 20 UNITS/100 ML INFUSION FOR SHOCK
0.0000 [IU]/min | INTRAVENOUS | Status: DC
  Filled 2024-05-01: qty 100

## 2024-05-01 MED ORDER — LACTATED RINGERS IV SOLN: INTRAVENOUS | Status: DC

## 2024-05-01 MED ORDER — KETAMINE HCL 10 MG/ML IJ SOLN
INTRAMUSCULAR | Status: AC
  Filled 2024-05-01: qty 1

## 2024-05-01 MED ORDER — SODIUM CHLORIDE 0.9 % IV SOLN
500.0000 mg | Freq: Once | INTRAVENOUS | Status: AC
  Administered 2024-05-01: 500 mg via INTRAVENOUS
  Filled 2024-05-01: qty 5

## 2024-05-01 MED ORDER — AMIODARONE HCL IN DEXTROSE 360-4.14 MG/200ML-% IV SOLN: 30.0000 mg/h | INTRAVENOUS | Status: DC

## 2024-05-02 MED FILL — Medication: Qty: 1 | Status: AC

## 2024-05-03 LAB — CULTURE, BLOOD (ROUTINE X 2)
Culture: NO GROWTH
Culture: NO GROWTH

## 2024-05-26 NOTE — ED Triage Notes (Addendum)
 Patient's son reports patient has been feeling SOB for weeks, fatigue/weakness, patient usually a/o x 4 but now confused. Patient does not know place or time.

## 2024-05-26 NOTE — Sepsis Progress Note (Signed)
 Notified bedside nurse of need to draw 2nd lactic acid.

## 2024-05-26 NOTE — ED Provider Notes (Incomplete)
 " [place] EMERGENCY DEPARTMENT AT [facility] Provider Note   CSN: [id number] Arrival date & time: May 09, 2024  1500     History Chief Complaint  Patient presents with   Altered Mental Status    HPI: Jill Bennett is a 79 y.o. female with history pertinent for HTN, CKD, depression, GERD, HTN, osteoporosis, prior CVA who presents complaining of multiple complaints. Patient arrived via POV accompanied by son.  History provided by patient and relative: Son.  No interpreter required during this encounter.  Son reports that he and the patient live together.  Reports that patient is typically ANO x 4.  Reports that patient has been complaining progressively of weakness, fatigue, shortness of breath over the past several weeks.  Reports that she was recently scheduled to have an appointment with her PCP, however it was canceled due to the snow.  Reports that over the past several days the patient has had decreased oral intake, has been complaining more that she cannot breathe, and has been eating and drinking less.  Reports that patient is usually alert and oriented x 4, however today and yesterday in particular has been confused, has not recognized who he or his brother was, therefore they decided to bring her to the emergency department for further evaluation, does not use oxygen at baseline, does not have a known history of COPD or asthma.  Patient denies abdominal pain, chest pain, does endorse shortness of breath.  Patient's recorded medical, surgical, social, medication list and allergies were reviewed in the Snapshot window as part of the initial history.   Prior to Admission medications  Medication Sig Start Date End Date Taking? Authorizing Provider  acetaminophen  (TYLENOL ) 500 MG tablet Take 500 mg by mouth 2 (two) times daily as needed for pain.    [provider]  amLODipine (NORVASC) 5 MG tablet Take 5 mg by mouth daily.    [provider]  aspirin EC 81 MG  tablet Take 81 mg by mouth daily.    [provider]  CAPSAICIN EX Apply 1 application topically as needed (for arthritis pain).    [provider]  dicyclomine (BENTYL) 20 MG tablet Take 20 mg by mouth 3 (three) times daily.    [provider]  EPINEPHrine  (EPIPEN ) 0.3 mg/0.3 mL SOAJ Inject 0.3 mg into the muscle as needed (for allergic reactions).    [provider]  hydrochlorothiazide (HYDRODIURIL) 25 MG tablet Take 25 mg by mouth daily.    [provider]  HYDROcodone -acetaminophen  (NORCO/VICODIN) 5-325 MG per tablet Take 1 tablet by mouth every 6 (six) hours as needed for pain. 09/30/12   Almarie Heron LABOR, PA-C  lovastatin (MEVACOR) 20 MG tablet Take 20 mg by mouth daily.    [provider]  naproxen sodium (ANAPROX) 220 MG tablet Take 220 mg by mouth 2 (two) times daily as needed.    [provider]  omeprazole (PRILOSEC) 20 MG capsule Take 20 mg by mouth 2 (two) times daily.    [provider]  OVER THE COUNTER MEDICATION Place 1 drop into both eyes as needed (dry eyes). Eye drops    [provider]  sertraline (ZOLOFT) 100 MG tablet Take 100 mg by mouth 2 (two) times daily.    [provider]  traZODone (DESYREL) 50 MG tablet Take 100 mg by mouth at bedtime.    [provider]     Allergies: Codeine and Tape   Review of Systems   ROS  as per HPI  Physical Exam Updated Vital Signs Pulse (!) 140   Resp (!) 36  Physical Exam Vitals and nursing note reviewed.  Constitutional:      General: She is not in acute distress.    Appearance: She is well-developed.  HENT:     Head: Normocephalic and atraumatic.  Eyes:     Conjunctiva/sclera: Conjunctivae normal.  Cardiovascular:     Rate and Rhythm: Regular rhythm. Tachycardia present.     Heart sounds: No murmur heard. Pulmonary:     Effort: Tachypnea and accessory muscle usage present.     Breath sounds: Decreased air movement (Diffusely)  present.  Abdominal:     Palpations: Abdomen is soft.     Tenderness: There is abdominal tenderness (Mild, diffuse).  Musculoskeletal:        General: No swelling.     Cervical back: Neck supple.     Right lower leg: Edema present.     Left lower leg: Edema present.  Skin:    General: Skin is warm and dry.     Capillary Refill: Capillary refill takes less than 2 seconds.  Neurological:     Mental Status: She is alert.  Psychiatric:        Mood and Affect: Mood normal.     ED Course/ Medical Decision Making/ A&P Clinical Course as of 05-22-24 2340  Wed 05/22/24  1915 Jill Bennett, not an [place] case [LS]    Clinical Course User Index [LS] Jill Jerilynn RAMAN, MD    Procedures Procedures   Medications Ordered in ED Medications  lactated ringers  infusion (has no administration in time range)  lactated ringers  bolus 1,000 mL (has no administration in time range)    And  lactated ringers  bolus 1,000 mL (has no administration in time range)  cefTRIAXone  (ROCEPHIN ) 2 g in sodium chloride  0.9 % 100 mL IVPB (has no administration in time range)  azithromycin  (ZITHROMAX ) 500 mg in sodium chloride  0.9 % 250 mL IVPB (has no administration in time range)  ipratropium-albuterol  (DUONEB) 0.5-2.5 (3) MG/3ML nebulizer solution 3 mL (has no administration in time range)    And  ipratropium-albuterol  (DUONEB) 0.5-2.5 (3) MG/3ML nebulizer solution 3 mL (has no administration in time range)    And  ipratropium-albuterol  (DUONEB) 0.5-2.5 (3) MG/3ML nebulizer solution 3 mL (has no administration in time range)    Medical Decision Making:   RANAY KETTER is a 79 y.o. female who presents for multiple symptoms as per above.  Physical exam is pertinent for 2+ bilateral lower extremity edema, tachycardia, tachypnea, retractions, diminished air movement throughout, mild diffuse abdominal tenderness.   The differential includes but is not limited to COPD exacerbation, pneumonia, sepsis, hypercarbic  respiratory failure, hypoxic respiratory failure, pulmonary embolism, ACS, stroke, ICH, metabolic encephalopathy, dehydration.  Independent historian: Relative: Son  External data reviewed: Labs: reviewed prior labs for baseline  Initial Plan:  Screening labs including CBC and Metabolic panel to evaluate for infectious or metabolic etiology of disease.  CK to evaluate for rhabdomyolysis, screening magnesium level given diffuse weakness/fatigue Screening lactic acid, coags to evaluate for sepsis Urinalysis with reflex culture ordered to evaluate for UTI or relevant urologic/nephrologic pathology.  COVID/flu/RSV given respiratory symptoms Screening BMP to assess fluid status given bilateral lower extremity swelling VBG to assess for hypercarbic respiratory failure given increased work of breathing Chest x-ray to evaluate for structural/infectious intra-thoracic pathology given multiple vital sign abnormalities CT head to evaluate for acute intracranial process given altered mental  status per son CT abdomen pelvis to evaluate for structural/infectious intra-abdominal pathology given diffuse abdominal tenderness CT PE study to assess for pulmonary embolism given increased work of breathing, hypoxia, hypercarbia EKG and serial troponin to evaluate for cardiac pathology. Objective evaluation as below reviewed   Labs: Ordered, Independent interpretation, and Details: Lactic acid elevated at 5.5.  CK WNL.  Coags reassuring, VBG with acidosis without hypercarbia, likely related to lactic acidosis. CMP with AKI to 2.3 with symmetric elevation of BUN, most recent prior from OSH elevated at 1.5.  Mild hyperkalemia to 5.8.  BNP significantly elevated at 9000, troponin mildly elevated at 49.  Radiology: Ordered, Independent interpretation, Details: Initial chest x-ray without focal airspace opacification, cardiomediastinal switcher Intran, pneumothorax, pleural effusion, bony derangement.  Repeat chest  x-ray with appropriate placement of ET tube,, and All images reviewed independently.  Agree with radiology report at this time.   No results found.  EKG/Medicine tests: Ordered and Independent interpretation EKG Interpretation:    Interventions: 30 cc/kg LR bolus, DuoNebs given diffusely diminished breath sounds throughout with increased work of breathing, empiric ceftriaxone  and azithromycin  to cover pulmonary etiology of sepsis  See the EMR for full details regarding lab and imaging results.  Patient presents from a nursing home with difficulty obtaining vitals and triage, thus patient was brought back to recess today, patient is provide limited historian, however is tachycardic, tachypneic, oxygenating in the 80s on room air, thus placed on supplemental oxygen, DuoNebs ordered broad lab and imaging workup indicating as per initial plan above.  Notified by nursing that patient unfortunately had worsening tachypnea despite DuoNebs and nonrebreather.  Reassessment of labs at that time did demonstrate significant elevation of BNP concerning for heart failure, EKG with A-fib RVR, given concern that tachycardia may can be contributing to shortness of breath, do feel that patient requires rate control, concern for new underlying heart failure, therefore ordered amiodarone  and lieu of diltiazem, and I promptly presented to bedside.  Patient continues to have diminished breath sounds throughout, initiated on BiPAP.  I remained at bedside for this.  Given patient did have significant elevation of BNP, fluids were paused as I felt that further fluids could worsen patient's volume status and likely underlying pulmonary edema and contribute to worsening respiratory status.  Patient significantly agitated, though did have some improvement agitation on BiPAP, respiratory rate improved from 40s to 50s to 20s, though patient still complaining intermittently that she could not breathe, therefore given 1 mg IV Haldol  as  well as agitation, did not administer benzodiazepines as I did not want to worsen patient's respiratory status.  While I remained at the bedside, patient continued to be normotensive, improved respiratory status, however significant A-fib RVR, therefore initiated on amiodarone .  Approximately 5 minutes into patient's amiodarone  load, she had decrease in heart rate to the 90s and became less agitated, and rather somnolent, thus I requested that amiodarone  be paused and that was done, patient did have pulses at that time, however despite pausing amiodarone , patient did lose pulses, thus CPR initiated.  Nursing documentation of first code: Compressions started at 1709. I/o inserted at 1711. 1mg  Epi at 1712 Intubated 22 at teeth 1714 Amp of bicarb given 1715 1mg  epi 1717 1mg  epi 1721 1mg  epi 1725 1mg  epi 1729 1g CaCl 1731 Norepi started at 20mcg 1734  100mg  Ketamine  1800   Did personally insert the patient's IO, and ran the patient's code, Dr. Emil was available, thus assisted by performing intubation, as well as arterial  line insertion while I managed patient's code.  Given dense VBG had demonstrated mild acidosis, bicarb was utilized, as well as calcium chloride given patient did have mild hyperkalemia to 5.8.  Patient with poorly palpable pulses throughout code, therefore primarily utilized visualization of the femoral artery on ultrasound during code to determine presence versus absence of pulses, patient did occasionally have flickering of Doppler color on ultrasound despite lack of palpable pulses.  Ultimately ROSC was achieved, patient did have some spontaneous movement.  Patient was significantly hypotensive on arterial line, therefore norepinephrine  started, given significant hypotension, utilized ketamine  for sedation rather than propofol  given concern for propofol  worsening hemodynamics.  I did attempt a central line however unfortunately given significant respiratory variation of the size  of this vessel and overall small size of patient, was not successful, given patient had MAP in the 70s on 20 micrograms of Levophed , deferred additional attempts in order to instead contact critical care to obtain definitive management for patient.  After initial ROSC, I consulted critical care, and updated the patient's son in the conference room.  Discussed that patient was very sick, had a breathing tube as well as an arterial line, and was very sick, and that her heart had stopped for a prolonged period.  Son confirmed that patient was full code, however that he needed to further discuss with his brother who needed to drop his dog off at home.  I placed a page to critical care, and return to the patient's room to check on the patient.  On my arrival to the room patient initially continued to have MAP in the 70s on 20 mcg of Levophed  gtt., however began downtrending.  I called pharmacy, requested their presence as well as vaso, I personally pushed total of 800 mcg of phenylephrine  via the IO in 160 mcg (2 cc) increments.  Unfortunately despite this, patient lost palpable pulses as well as significant waveform amplitude on arterial line, thus second code/second round of CPR was initiated.  Nursing documentation of second code: Phenylephrine  given in increments. [Sic, I did push entire of phenylephrine  prior to second code] Compressions restarted at 1817 Norepi at 1822 Vaso 0.03 at 1823 1mg  epi 1825 50 bicarb 1827 1mg  epi 1825 bicarb 1827 1g cacl 1827 Epinephrine  started at 5mcg/min at 1828  ROSC was obtained at approximately 1829.  I did personally perform the last round of compressions during the second code.  Patient was initiated on epinephrine  gtt. during second code per advice of ED pharmacist at bedside.  Again patient was given ketamine  for sedation given concerns that propofol  could worsen hemodynamics.  Patient was hypertensive with MAP in the 110s on 100 mcg  of Levophed  gtt., not titratable vasopressin , as well as 5 mcg epinephrine  gtt., thus Levophed  was decreased to 80 mcg    1mg  epi 1825 50mcg bicarb 1827 1g cacl 1827 Epinephrine  started at 5mcg/min at 1828 100mg  ketamine  1832 decrease to norepi at 1633 1836 160 mcg phenylephinre 1837 phenylephrine  1840 1mg  epi 1843 1mg  epi 1846 1mg  epi Time of death 1903   Phenylephrine  given in increments. Compressions restarted at 1817 Norepi 100mcg at 1822 Vaso 0.03 at 18231mg  epi 1825 50 bicarb 1827      {LSCOPA:33420}  Discussion of management or test interpretations with external provider(s): ***  Risk Drugs:{LSDRUGS:33399} Treatment: {LSTREATMENT:33409} Surgery:{LSSURGERY:33410} Critical Care: ***  Disposition: {LSDISPO:33388}  MDM generated using voice dictation software and may contain dictation errors.  Please contact me for  any clarification or with any questions.  Clinical Impression: No diagnosis found.   Data Unavailable   Final Clinical Impression(s) / ED Diagnoses Final diagnoses:  None    Rx / DC Orders ED Discharge Orders     None      "

## 2024-05-26 NOTE — ED Notes (Signed)
 200mcg Phenylephrine  given in increments. Compressions restarted at 1817 Norepi 100mcg at 1822 Vaso 0.03 at 18231mg  epi 1825 50 bicarb 1827

## 2024-05-26 NOTE — Sepsis Progress Note (Signed)
 Elink will follow per sepsis protocol.

## 2024-05-26 NOTE — ED Notes (Signed)
 1mg  epi 1825 bicarb 1827 1g cacl 1827 Epinephrine  started at 5mcg/min at 1828 100mg  ketamine  1832 decrease to norepi at 1633 1836 160 mcg phenylephinre 1837 480mcg phenylephrine  1840 1mg  epi 1843 1mg  epi 1846 1mg  epi Time of death 05-29-1901

## 2024-05-26 NOTE — Progress Notes (Signed)
 RT manually ventilated pt while she was coding. RT continued to manually ventilate until pt's sons wanted CPR to stop.

## 2024-05-26 NOTE — ED Provider Notes (Signed)
 Procedure Name: Intubation Date/Time: 2024-05-06 6:35 PM  Performed by: Emil Share, DOPre-anesthesia Checklist: Patient identified, Patient being monitored, Emergency Drugs available, Timeout performed and Suction available Oxygen Delivery Method: Non-rebreather mask Preoxygenation: Pre-oxygenation with 100% oxygen Induction Type: Rapid sequence Ventilation: Mask ventilation without difficulty Laryngoscope Size: Glidescope Grade View: Grade I Tube size: 7.0 mm Number of attempts: 1 Airway Equipment and Method: Video-laryngoscopy Placement Confirmation: ETT inserted through vocal cords under direct vision, CO2 detector and Breath sounds checked- equal and bilateral Secured at: 22 cm Tube secured with: ETT holder Dental Injury: Teeth and Oropharynx as per pre-operative assessment  Difficulty Due To: Difficulty was anticipated Future Recommendations: Recommend- induction with short-acting agent, and alternative techniques readily available    ARTERIAL LINE  Date/Time: 05-06-24 6:35 PM  Performed by: Emil Share, DO Authorized by: Emil Share, DO   Consent:    Consent obtained:  Emergent situation Indications:    Indications: hemodynamic monitoring   Pre-procedure details:    Skin preparation:  Chlorhexidine   Preparation: Patient was prepped and draped in sterile fashion   Procedure details:    Location:  R femoral   Needle gauge:  20 G   Placement technique:  Seldinger   Number of attempts:  1   Transducer: waveform confirmed   Post-procedure details:    Post-procedure:  Biopatch applied, sterile dressing applied and sutured   CMS:  Normal   Procedure completion:  Tolerated well, no immediate complications     Emil Share, DO 05-06-2024 1836

## 2024-05-26 NOTE — ED Provider Notes (Signed)
 " [place] EMERGENCY DEPARTMENT AT [facility] Provider Note   CSN: [id number] Arrival date & time: May 27, 2024  1500     History Chief Complaint  Patient presents with   Altered Mental Status    HPI: Jill Bennett is a 79 y.o. female with history pertinent for HTN, CKD, depression, GERD, HTN, osteoporosis, prior CVA who presents complaining of multiple complaints. Patient arrived via POV accompanied by son.  History provided by patient and relative: Son.  No interpreter required during this encounter.  Son reports that he and the patient live together.  Reports that patient is typically ANO x 4.  Reports that patient has been complaining progressively of weakness, fatigue, shortness of breath over the past several weeks.  Reports that she was recently scheduled to have an appointment with her PCP, however it was canceled due to the snow.  Reports that over the past several days the patient has had decreased oral intake, has been complaining more that she cannot breathe, and has been eating and drinking less.  Reports that patient is usually alert and oriented x 4, however today and yesterday in particular has been confused, has not recognized who he or his brother was, therefore they decided to bring her to the emergency department for further evaluation, does not use oxygen at baseline, does not have a known history of COPD or asthma.  Patient denies abdominal pain, chest pain, does endorse shortness of breath.  Patient's recorded medical, surgical, social, medication list and allergies were reviewed in the Snapshot window as part of the initial history.   Prior to Admission medications  Medication Sig Start Date End Date Taking? Authorizing Provider  acetaminophen  (TYLENOL ) 500 MG tablet Take 500 mg by mouth 2 (two) times daily as needed for pain.    [provider]  amLODipine (NORVASC) 5 MG tablet Take 5 mg by mouth daily.    [provider]  aspirin EC 81 MG  tablet Take 81 mg by mouth daily.    [provider]  CAPSAICIN EX Apply 1 application topically as needed (for arthritis pain).    [provider]  dicyclomine (BENTYL) 20 MG tablet Take 20 mg by mouth 3 (three) times daily.    [provider]  EPINEPHrine  (EPIPEN ) 0.3 mg/0.3 mL SOAJ Inject 0.3 mg into the muscle as needed (for allergic reactions).    [provider]  hydrochlorothiazide (HYDRODIURIL) 25 MG tablet Take 25 mg by mouth daily.    [provider]  HYDROcodone -acetaminophen  (NORCO/VICODIN) 5-325 MG per tablet Take 1 tablet by mouth every 6 (six) hours as needed for pain. 09/30/12   Almarie Heron LABOR, PA-C  lovastatin (MEVACOR) 20 MG tablet Take 20 mg by mouth daily.    [provider]  naproxen sodium (ANAPROX) 220 MG tablet Take 220 mg by mouth 2 (two) times daily as needed.    [provider]  omeprazole (PRILOSEC) 20 MG capsule Take 20 mg by mouth 2 (two) times daily.    [provider]  OVER THE COUNTER MEDICATION Place 1 drop into both eyes as needed (dry eyes). Eye drops    [provider]  sertraline (ZOLOFT) 100 MG tablet Take 100 mg by mouth 2 (two) times daily.    [provider]  traZODone (DESYREL) 50 MG tablet Take 100 mg by mouth at bedtime.    [provider]     Allergies: Codeine and Tape   Review of Systems   ROS  as per HPI  Physical Exam Updated Vital Signs Pulse (!) 140   Resp (!) 36  Physical Exam Vitals and nursing note reviewed.  Constitutional:      General: She is not in acute distress.    Appearance: She is well-developed.  HENT:     Head: Normocephalic and atraumatic.  Eyes:     Conjunctiva/sclera: Conjunctivae normal.  Cardiovascular:     Rate and Rhythm: Regular rhythm. Tachycardia present.     Heart sounds: No murmur heard. Pulmonary:     Effort: Tachypnea and accessory muscle usage present.     Breath sounds: Decreased air movement (Diffusely)  present.  Abdominal:     Palpations: Abdomen is soft.     Tenderness: There is abdominal tenderness (Mild, diffuse).  Musculoskeletal:        General: No swelling.     Cervical back: Neck supple.     Right lower leg: Edema present.     Left lower leg: Edema present.  Skin:    General: Skin is warm and dry.     Capillary Refill: Capillary refill takes less than 2 seconds.  Neurological:     Mental Status: She is alert.  Psychiatric:        Mood and Affect: Mood normal.     ED Course/ Medical Decision Making/ A&P    Procedures Procedures   Medications Ordered in ED Medications  lactated ringers  infusion (has no administration in time range)  lactated ringers  bolus 1,000 mL (has no administration in time range)    And  lactated ringers  bolus 1,000 mL (has no administration in time range)  cefTRIAXone  (ROCEPHIN ) 2 g in sodium chloride  0.9 % 100 mL IVPB (has no administration in time range)  azithromycin  (ZITHROMAX ) 500 mg in sodium chloride  0.9 % 250 mL IVPB (has no administration in time range)  ipratropium-albuterol  (DUONEB) 0.5-2.5 (3) MG/3ML nebulizer solution 3 mL (has no administration in time range)    And  ipratropium-albuterol  (DUONEB) 0.5-2.5 (3) MG/3ML nebulizer solution 3 mL (has no administration in time range)    And  ipratropium-albuterol  (DUONEB) 0.5-2.5 (3) MG/3ML nebulizer solution 3 mL (has no administration in time range)    Medical Decision Making:   Jill Bennett is a 79 y.o. female who presents for multiple symptoms as per above.  Physical exam is pertinent for 2+ bilateral lower extremity edema, tachycardia, tachypnea, retractions, diminished air movement throughout, mild diffuse abdominal tenderness.   The differential includes but is not limited to COPD exacerbation, pneumonia, sepsis, hypercarbic respiratory failure, hypoxic respiratory failure, pulmonary embolism, ACS, stroke, ICH, metabolic encephalopathy, dehydration.  Independent historian:  Relative: Son  External data reviewed: {LSEXTERNALDATA:33407}  Initial Plan:  Screening labs including CBC and Metabolic panel to evaluate for infectious or metabolic etiology of disease.  CK to evaluate for rhabdomyolysis, screening magnesium level given diffuse weakness/fatigue Screening lactic acid, coags to evaluate for sepsis Urinalysis with reflex culture ordered to evaluate for UTI or relevant urologic/nephrologic pathology.  COVID/flu/RSV given respiratory symptoms Screening BMP to assess fluid status given bilateral lower extremity swelling VBG to assess for hypercarbic respiratory failure given increased work of breathing Chest x-ray to evaluate for structural/infectious intra-thoracic pathology given multiple vital sign abnormalities CT head to evaluate for acute intracranial process given altered mental status per son CT abdomen pelvis to evaluate for structural/infectious intra-abdominal pathology given diffuse abdominal tenderness CT PE study to assess for pulmonary embolism given increased work of breathing, hypoxia, hypercarbia EKG and serial troponin to evaluate  for cardiac pathology. Objective evaluation as below reviewed   Labs: Ordered, Independent interpretation, and Details: ***  Radiology: Ordered, Independent interpretation, Details: ***, and All images reviewed independently. ***Agree with radiology report at this time.   No results found.  EKG/Medicine tests: Ordered and Independent interpretation EKG Interpretation:    Interventions: 30 cc/kg LR bolus, DuoNebs given diffusely diminished breath sounds throughout with increased work of breathing, empiric ceftriaxone  and azithromycin  to cover pulmonary etiology of sepsis  See the EMR for full details regarding lab and imaging results.  Patient presents from a nursing home with difficulty obtaining vitals and triage, thus patient was brought back to recess today, patient is provide limited historian, however is  tachycardic, tachypneic, oxygenating in the 80s on room air, thus placed on supplemental oxygen, broad lab and imaging workup indicating as per initial plan above.  {LSCOPA:33420}  Discussion of management or test interpretations with external provider(s): ***  Risk Drugs:{LSDRUGS:33399} Treatment: {LSTREATMENT:33409} Surgery:{LSSURGERY:33410} Critical Care: ***  Disposition: {LSDISPO:33388}  MDM generated using voice dictation software and may contain dictation errors.  Please contact me for any clarification or with any questions.  Clinical Impression: No diagnosis found.   Data Unavailable   Final Clinical Impression(s) / ED Diagnoses Final diagnoses:  None    Rx / DC Orders ED Discharge Orders     None      "

## 2024-05-26 NOTE — ED Notes (Signed)
 Compressions started at 1709. I/o inserted at 1711. 1mg  Epi at 1712 Intubated 22 at teeth 1714 Amp of bicarb given 1715 1mg  epi 1717 1mg  epi 1721 1mg  epi 1725 1mg  epi 1729 1g CaCl 1731 Norepi started at 20mcg 1734  100mg  Ketamine  1800

## 2024-05-26 NOTE — Progress Notes (Signed)
" °   05/12/2024 1900  Spiritual Encounters  Type of Visit Initial  Care provided to: Family  Referral source Nurse (RN/NT/LPN)  Reason for visit Patient death  OnCall Visit Yes  Spiritual Framework  Presenting Themes Meaning/purpose/sources of inspiration;Impactful experiences and emotions  Values/beliefs Christian  Community/Connection Family  Family Stress Factors Loss  Interventions  Spiritual Care Interventions Made Established relationship of care and support;Compassionate presence;Prayer;Bereavement/grief support  Intervention Outcomes  Outcomes Connection to spiritual care;Reduced isolation   Chaplain paged to support family following pt death in ED. Chaplain provided compassionate presence, reflective listening and grief support to pt's sons. Chaplain provided pt's belongings that RN had placed in bag to pt's family members and contact information for patient placement after they decide on funeral home. They expressed appreciation for support. Chaplain team available to provide additional support. If urgent need arises, please page Chaplain Team [phone].  Chaplain [place], [place]. Div[place]  "

## 2024-05-26 DEATH — deceased
